# Patient Record
Sex: Male | Born: 1985 | Hispanic: No | Marital: Single | Smoking: Never smoker
Health system: Southern US, Community
[De-identification: ages and names within clinical notes are randomized; demographics above are authoritative.]

## PROBLEM LIST (undated history)

## (undated) DIAGNOSIS — E119 Type 2 diabetes mellitus without complications: Secondary | ICD-10-CM

## (undated) HISTORY — PX: TONSILLECTOMY: SUR1361

---
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
## 2015-09-29 ENCOUNTER — Emergency Department (HOSPITAL_BASED_OUTPATIENT_CLINIC_OR_DEPARTMENT_OTHER)
Admission: EM | Admit: 2015-09-29 | Discharge: 2015-09-29 | Disposition: A | Discharge status: Home / Self Care | Payer: 59 | Attending: Emergency Medicine | Admitting: Emergency Medicine

## 2015-09-29 ENCOUNTER — Encounter (HOSPITAL_BASED_OUTPATIENT_CLINIC_OR_DEPARTMENT_OTHER): Payer: Self-pay | Admitting: Emergency Medicine

## 2015-09-29 DIAGNOSIS — E1065 Type 1 diabetes mellitus with hyperglycemia: Secondary | ICD-10-CM | POA: Insufficient documentation

## 2015-09-29 DIAGNOSIS — R739 Hyperglycemia, unspecified: Secondary | ICD-10-CM

## 2015-09-29 DIAGNOSIS — Z760 Encounter for issue of repeat prescription: Secondary | ICD-10-CM | POA: Diagnosis not present

## 2015-09-29 HISTORY — DX: Type 2 diabetes mellitus without complications: E11.9

## 2015-09-29 LAB — CBC WITH DIFFERENTIAL/PLATELET
BASOS ABS: 0 10*3/uL (ref 0.0–0.1)
BASOS PCT: 1 %
Eosinophils Absolute: 0.2 10*3/uL (ref 0.0–0.7)
Eosinophils Relative: 3 %
HEMATOCRIT: 47.6 % (ref 39.0–52.0)
Hemoglobin: 16.8 g/dL (ref 13.0–17.0)
LYMPHS PCT: 35 %
Lymphs Abs: 2.2 10*3/uL (ref 0.7–4.0)
MCH: 31.4 pg (ref 26.0–34.0)
MCHC: 35.3 g/dL (ref 30.0–36.0)
MCV: 89 fL (ref 78.0–100.0)
MONO ABS: 0.5 10*3/uL (ref 0.1–1.0)
Monocytes Relative: 8 %
NEUTROS ABS: 3.4 10*3/uL (ref 1.7–7.7)
NEUTROS PCT: 53 %
Platelets: 246 10*3/uL (ref 150–400)
RBC: 5.35 MIL/uL (ref 4.22–5.81)
RDW: 11.7 % (ref 11.5–15.5)
WBC: 6.2 10*3/uL (ref 4.0–10.5)

## 2015-09-29 LAB — BASIC METABOLIC PANEL
ANION GAP: 14 (ref 5–15)
BUN: 14 mg/dL (ref 6–20)
CALCIUM: 9.5 mg/dL (ref 8.9–10.3)
CO2: 25 mmol/L (ref 22–32)
Chloride: 95 mmol/L — ABNORMAL LOW (ref 101–111)
Creatinine, Ser: 1.13 mg/dL (ref 0.61–1.24)
GLUCOSE: 391 mg/dL — AB (ref 65–99)
POTASSIUM: 3.8 mmol/L (ref 3.5–5.1)
Sodium: 134 mmol/L — ABNORMAL LOW (ref 135–145)

## 2015-09-29 LAB — URINE MICROSCOPIC-ADD ON
BACTERIA UA: NONE SEEN
RBC / HPF: NONE SEEN RBC/hpf (ref 0–5)
Squamous Epithelial / LPF: NONE SEEN

## 2015-09-29 LAB — URINALYSIS, ROUTINE W REFLEX MICROSCOPIC
Bilirubin Urine: NEGATIVE
Glucose, UA: >1000 mg/dL — AB
Hgb urine dipstick: NEGATIVE
KETONES UR: 40 mg/dL — AB
LEUKOCYTES UA: NEGATIVE
NITRITE: NEGATIVE
PH: 5 (ref 5.0–8.0)
PROTEIN: NEGATIVE mg/dL
Specific Gravity, Urine: 1.044 — ABNORMAL HIGH (ref 1.005–1.030)

## 2015-09-29 LAB — CBG MONITORING, ED
GLUCOSE-CAPILLARY: 381 mg/dL — AB (ref 65–99)
Glucose-Capillary: 301 mg/dL — ABNORMAL HIGH (ref 65–99)

## 2015-09-29 MED ORDER — INSULIN GLARGINE 100 UNIT/ML ~~LOC~~ SOLN: 22.0000 [IU] | Freq: Every day | SUBCUTANEOUS | Status: DC

## 2015-09-29 MED ORDER — SODIUM CHLORIDE 0.9 % IV BOLUS (SEPSIS)
1000.0000 mL | Freq: Once | INTRAVENOUS | Status: AC
  Administered 2015-09-29: 1000 mL via INTRAVENOUS

## 2015-09-29 MED ORDER — INSULIN ASPART 100 UNIT/ML ~~LOC~~ SOLN
10.0000 [IU] | Freq: Once | SUBCUTANEOUS | Status: AC
  Administered 2015-09-29: 10 [IU] via SUBCUTANEOUS
  Filled 2015-09-29: qty 1

## 2015-09-29 MED ORDER — METFORMIN HCL 500 MG PO TABS: 500.0000 mg | ORAL_TABLET | Freq: Two times a day (BID) | ORAL | Status: DC

## 2015-09-29 NOTE — ED Notes (Signed)
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

## 2015-09-29 NOTE — ED Provider Notes (Signed)
CSN: [id number]     Arrival date & time 09/29/15  1054 History   First MD Initiated Contact with Patient 09/29/15 1110     No chief complaint on file.    (Consider location/radiation/quality/duration/timing/severity/associated sxs/prior Treatment) HPI   30 year old male with history of juvenile diabetes presenting with concerns for high blood sugar. Patient states he ran out of his medication for the past 2 weeks due to a missed appointment with his PCP for medication refill. States that his father passed away 2 weeks ago and since then he has been going through the grieving process and having been taking care of himself appropriately. He was taking Lantus and metformin. He reports polyuria, polydipsia, generalized fatigue, then today he check his blood sugar and it was in the 500s. He denies any recent sickness, fever, productive cough, chest pain, shortness of breath, or rash. No other complaint. Denies alcohol abuse or tobacco abuse.  History reviewed. No pertinent past medical history. History reviewed. No pertinent past surgical history. No family history on file. Social History  Substance Use Topics  . Smoking status: None  . Smokeless tobacco: None  . Alcohol Use: None    Review of Systems  All other systems reviewed and are negative.     Allergies  Review of patient's allergies indicates not on file.  Home Medications   Prior to Admission medications   Not on File   BP 147/97 mmHg  Pulse 67  Temp(Src) 97.6 F (36.4 C) (Oral)  Resp 20  Ht  (1.981 m)  Wt 124.427 kg  BMI 31.71 kg/m2  SpO2 100% Physical Exam  Constitutional: He is oriented to person, place, and time. He appears well-developed and well-nourished. No distress.  African-American male, well appearing, nontoxic in appearance.  HENT:  Head: Atraumatic.  Mouth/Throat: Oropharynx is clear and moist.  Eyes: Conjunctivae are normal.  Neck: Neck supple.  Cardiovascular: Normal rate and regular  rhythm.   Pulmonary/Chest: Effort normal and breath sounds normal.  Abdominal: Soft. There is no tenderness.  Neurological: He is alert and oriented to person, place, and time.  Skin: No rash noted.  Psychiatric: He has a normal mood and affect.  Nursing note and vitals reviewed.   ED Course  Procedures (including critical care time) Labs Review Labs Reviewed  BASIC METABOLIC PANEL - Abnormal; Notable for the following:    Sodium 134 (*)    Chloride 95 (*)    Glucose, Bld 391 (*)    All other components within normal limits  URINALYSIS, ROUTINE W REFLEX MICROSCOPIC (NOT AT [facility]) - Abnormal; Notable for the following:    Color, Urine STRAW (*)    Specific Gravity, Urine 1.044 (*)    Glucose, UA >1000 (*)    Ketones, ur 40 (*)    All other components within normal limits  CBG MONITORING, ED - Abnormal; Notable for the following:    Glucose-Capillary 381 (*)    All other components within normal limits  CBG MONITORING, ED - Abnormal; Notable for the following:    Glucose-Capillary 301 (*)    All other components within normal limits  CBC WITH DIFFERENTIAL/PLATELET  URINE MICROSCOPIC-ADD ON    Imaging Review No results found. I have personally reviewed and evaluated these images and lab results as part of my medical decision-making.   EKG Interpretation None      MDM   Final diagnoses:  Encounter for medication refill  Hyperglycemia    BP 136/74 mmHg  Pulse 68  Temp(Src) 97.6 F (36.4 C) (Oral)  Resp 20  Ht 6\' 6"  (1.981 m)  Wt 124.427 kg  BMI 31.71 kg/m2  SpO2 100%   11:26 AM Patient with history of insulin-dependent diabetes who ran out of his medication and is here with symptoms concerning for hypoglycemia and potential DKA. Patient however is well-appearing, no change in mental status and without any signs of infection. His current CBG is 381. We'll perform screening labs, give IV fluid, and if there is no significant metabolic derangement patient is  likely stable to be discharged with medication refill and close follow-up with PCP.  3:38 PM Although patient has ketones in urine, no evidence of DKA as his anion gap is normal. CBG improves after receiving insulin and fluid. Patient resting comfortably. I will prescribe his metformin and insulin as requested and strongly encouraged patient to follow-up closely with his primary care provider for further management of his condition. Return precaution discussed. Patient is stable for discharge. All questions answered to patient's satisfaction.  Fayrene HelperBowie Lakrisha Iseman, PA-C 09/29/15 1539  Lavera Guiseana Duo Liu, MD 09/29/15 1944

## 2015-09-29 NOTE — Discharge Instructions (Signed)
Please monitor your blood sugar regularly, take your medications as prescribed and follow up with your doctor for further care.    Hyperglycemia Hyperglycemia occurs when the glucose (sugar) in your blood is too high. Hyperglycemia can happen for many reasons, but it most often happens to people who do not know they have diabetes or are not managing their diabetes properly.  CAUSES  Whether you have diabetes or not, there are other causes of hyperglycemia. Hyperglycemia can occur when you have diabetes, but it can also occur in other situations that you might not be as aware of, such as: Diabetes  If you have diabetes and are having problems controlling your blood glucose, hyperglycemia could occur because of some of the following reasons:  Not following your meal plan.  Not taking your diabetes medications or not taking it properly.  Exercising less or doing less activity than you normally do.  Being sick. Pre-diabetes  This cannot be ignored. Before people develop Type 2 diabetes, they almost always have "pre-diabetes." This is when your blood glucose levels are higher than normal, but not yet high enough to be diagnosed as diabetes. Research has shown that some long-term damage to the body, especially the heart and circulatory system, may already be occurring during pre-diabetes. If you take action to manage your blood glucose when you have pre-diabetes, you may delay or prevent Type 2 diabetes from developing. Stress  If you have diabetes, you may be "diet" controlled or on oral medications or insulin to control your diabetes. However, you may find that your blood glucose is higher than usual in the hospital whether you have diabetes or not. This is often referred to as "stress hyperglycemia." Stress can elevate your blood glucose. This happens because of hormones put out by the body during times of stress. If stress has been the cause of your high blood glucose, it can be followed  regularly by your caregiver. That way he/she can make sure your hyperglycemia does not continue to get worse or progress to diabetes. Steroids  Steroids are medications that act on the infection fighting system (immune system) to block inflammation or infection. One side effect can be a rise in blood glucose. Most people can produce enough extra insulin to allow for this rise, but for those who cannot, steroids make blood glucose levels go even higher. It is not unusual for steroid treatments to "uncover" diabetes that is developing. It is not always possible to determine if the hyperglycemia will go away after the steroids are stopped. A special blood test called an A1c is sometimes done to determine if your blood glucose was elevated before the steroids were started. SYMPTOMS  Thirsty.  Frequent urination.  Dry mouth.  Blurred vision.  Tired or fatigue.  Weakness.  Sleepy.  Tingling in feet or leg. DIAGNOSIS  Diagnosis is made by monitoring blood glucose in one or all of the following ways:  A1c test. This is a chemical found in your blood.  Fingerstick blood glucose monitoring.  Laboratory results. TREATMENT  First, knowing the cause of the hyperglycemia is important before the hyperglycemia can be treated. Treatment may include, but is not be limited to:  Education.  Change or adjustment in medications.  Change or adjustment in meal plan.  Treatment for an illness, infection, etc.  More frequent blood glucose monitoring.  Change in exercise plan.  Decreasing or stopping steroids.  Lifestyle changes. HOME CARE INSTRUCTIONS   Test your blood glucose as directed.  Exercise regularly.  Your caregiver will give you instructions about exercise. Pre-diabetes or diabetes which comes on with stress is helped by exercising.  Eat wholesome, balanced meals. Eat often and at regular, fixed times. Your caregiver or nutritionist will give you a meal plan to guide your sugar  intake.  Being at an ideal weight is important. If needed, losing as little as 10 to 15 pounds may help improve blood glucose levels. SEEK MEDICAL CARE IF:   You have questions about medicine, activity, or diet.  You continue to have symptoms (problems such as increased thirst, urination, or weight gain). SEEK IMMEDIATE MEDICAL CARE IF:   You are vomiting or have diarrhea.  Your breath smells fruity.  You are breathing faster or slower.  You are very sleepy or incoherent.  You have numbness, tingling, or pain in your feet or hands.  You have chest pain.  Your symptoms get worse even though you have been following your caregiver's orders.  If you have any other questions or concerns.   This information is not intended to replace advice given to you by your health care provider. Make sure you discuss any questions you have with your health care provider.   Document Released: 09/30/2000 Document Revised: 06/29/2011 Document Reviewed: 12/11/2014 Elsevier Interactive Patient Education [employer]

## 2021-07-04 ENCOUNTER — Ambulatory Visit (HOSPITAL_BASED_OUTPATIENT_CLINIC_OR_DEPARTMENT_OTHER)
Admission: RE | Admit: 2021-07-04 | Discharge: 2021-07-04 | Disposition: A | Discharge status: Home / Self Care | Payer: 59 | Source: Ambulatory Visit | Attending: Family Medicine | Admitting: Family Medicine

## 2021-07-04 ENCOUNTER — Other Ambulatory Visit: Payer: Self-pay

## 2021-07-04 ENCOUNTER — Encounter: Payer: Self-pay | Admitting: Family Medicine

## 2021-07-04 ENCOUNTER — Ambulatory Visit (INDEPENDENT_AMBULATORY_CARE_PROVIDER_SITE_OTHER): Payer: 59 | Admitting: Family Medicine

## 2021-07-04 VITALS — BP 158/98 | HR 69 | Ht 78.0 in | Wt 245.6 lb

## 2021-07-04 DIAGNOSIS — M5442 Lumbago with sciatica, left side: Secondary | ICD-10-CM | POA: Insufficient documentation

## 2021-07-04 DIAGNOSIS — R030 Elevated blood-pressure reading, without diagnosis of hypertension: Secondary | ICD-10-CM | POA: Diagnosis not present

## 2021-07-04 DIAGNOSIS — M5441 Lumbago with sciatica, right side: Secondary | ICD-10-CM

## 2021-07-04 DIAGNOSIS — E119 Type 2 diabetes mellitus without complications: Secondary | ICD-10-CM
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
## 2021-07-04 NOTE — Patient Instructions (Signed)
Blood pressure is not at goal for age and co-morbidities.  I recommend monitoring for 2 weeks then follow-up.  In addition they were instructed on the following: ?- BP goal <130/80 ?- monitor and log blood pressures at home ?- check around the same time each day in a relaxed setting ?- Limit salt to <2000 mg/day ?- Follow DASH eating plan (heart healthy diet) ?- limit alcohol to 2 standard drinks per day for men and 1 per day for women ?- avoid tobacco products ?- get at least 2 hours of regular aerobic exercise weekly ?Patient aware of signs/symptoms requiring further/urgent evaluation. ? ? ?Thank you for choosing [facility]. I am excited for the opportunity to partner with you to meet your health care goals. It was a pleasure meeting you today! ? ? ?Information on diet, exercise, and health maintenance recommendations are listed below. This is information to help you be sure you are on track for optimal health and monitoring.  ? ?Please look over this and let us know if you have any questions or if you have completed any of the health maintenance outside of [facility] so that we can be sure your records are up to date.  ?___________________________________________________________ ? ?MyChart:  ?For all urgent or time sensitive needs we ask that you please call the office to avoid delays. Our number is [phone]. ?MyChart is not constantly monitored and due to the large volume of messages a day, replies may take up to 72 business hours. ? ?MyChart Policy: ?MyChart allows for you to see your visit notes, after visit summary, provider recommendations, lab and tests results, make an appointment, request refills, and contact your provider or the office for non-urgent questions or concerns. Providers are seeing patients during normal business hours and do not have built in time to review MyChart messages.  ?We ask that you allow a minimum of 3 business  days for responses to [facility] messages. For this reason, please do not send urgent requests through MyChart. Please call the office at [phone]. ?New and ongoing conditions may require a visit. We have virtual and in-person visits available for your convenience.  ?Complex MyChart concerns may require a visit. Your provider may request you schedule a virtual or in-person visit to ensure we are providing the best care possible. ?MyChart messages sent after 11:00 AM on Friday will not be received by the provider until Monday morning.  ?  ?Lab and Test Results: ?You will receive your lab and test results on MyChart as soon as they are completed and results have been sent by the lab or testing facility. Due to this service, you will receive your results BEFORE your provider.  ?I review lab and test results each morning prior to seeing patients. Some results require collaboration with other providers to ensure you are receiving the most appropriate care. For this reason, we ask that you please allow a minimum of 3-5 business days from the time that ALL results have been received for your provider to receive and review lab and test results and contact you about these.  ?Most lab and test result comments from the provider will be sent through MyChart. Your provider may recommend changes to the plan of care, follow-up visits, repeat testing, ask questions, or request an office visit to discuss these results. You may reply directly to this message or call the office to provide information for the provider or set up an  appointment. ?In some instances, you will be called with test results and recommendations. Please let us know if this is preferred and we will make note of this in your chart to provide this for you.    ?If you have not heard a response to your lab or test results in 5 business days from all results returning to MyChart, please call the office to let us know. We ask that you please avoid calling prior to  this time unless there is an emergent concern. Due to high call volumes, this can delay the resulting process. ? ?After Hours: ?For all non-emergency after hours needs, please call the office at [phone] and select the option to reach the on-call  service. On-call services are shared between multiple [place] offices and therefore it will not be possible to speak directly with your provider. On-call providers may provide medical advice and recommendations, but are unable to provide refills for maintenance medications.  ?For all emergency or urgent medical needs after normal business hours, we recommend that you seek care at the closest Urgent Care or Emergency Department to ensure appropriate treatment in a timely manner.  ?[facility] at [place] has a 24 hour emergency room located on the ground floor for your convenience.  ? ?Urgent Concerns During the Business Day ?Providers are seeing patients from 8AM to 5PM with a busy schedule and are most often not able to respond to non-urgent calls until the end of the day or the next business day. ?If you should have URGENT concerns during the day, please call and speak to the nurse or schedule a same day appointment so that we can address your concern without delay.  ? ?Thank you, again, for choosing me as your health care partner. I appreciate your trust and look forward to learning more about you.  ? ?Lollie Marrow. Reola Calkins, DNP, FNP-C ? ?___________________________________________________________ ? ?Health Maintenance Recommendations ?Screening Testing ?Mammogram ?Every 1-2 years based on history and risk factors ?Starting at age 23 ?Pap Smear ?Ages 21-39 every 3 years ?Ages 56-65 every 5 years with HPV testing ?More frequent testing may be required based on results and history ?Colon Cancer Screening ?Every 1-10 years based on test performed, risk factors, and history ?Starting at age 28 ?Bone Density Screening ?Every 2-10 years based on history ?Starting  at age 10 for women ?Recommendations for men differ based on medication usage, history, and risk factors ?AAA Screening ?One time ultrasound ?Men 32-36 years old who have ever smoked ?Lung Cancer Screening ?Low Dose Lung CT every 12 months ?Age 6-80 years with a 20 pack-year smoking history who still smoke or who have quit within the last 15 years ? ?Screening Labs ?Routine  Labs: Complete Blood Count (CBC), Complete Metabolic Panel (CMP), Cholesterol (Lipid Panel) ?Every 6-12 months based on history and medications ?May be recommended more frequently based on current conditions or previous results ?Hemoglobin A1c Lab ?Every 3-12 months based on history and previous results ?Starting at age 36 or earlier with diagnosis of diabetes, high cholesterol, BMI >26, and/or risk factors ?Frequent monitoring for patients with diabetes to ensure blood sugar control ?Thyroid Panel (TSH w/ T3 & T4) ?Every 6 months based on history, symptoms, and risk factors ?May be repeated more often if on medication ?HIV ?One time testing for all patients 51 and older ?May be repeated more frequently for patients with increased risk factors or exposure ?Hepatitis C ?One time testing for all patients 95 and older ?May be repeated more frequently  for patients with increased risk factors or exposure ?Gonorrhea, Chlamydia ?Every 12 months for all sexually active persons 13-24 years ?Additional monitoring may be recommended for those who are considered high risk or who have symptoms ?PSA ?Men 60-51 years old with risk factors ?Additional screening may be recommended from age 6-69 based on risk factors, symptoms, and history ? ?Vaccine Recommendations ?Tetanus Booster ?All adults every 10 years ?Flu Vaccine ?All patients 6 months and older every year ?COVID Vaccine ?All patients 12 years and older ?Initial dosing with booster ?May recommend additional booster based on age and health history ?HPV Vaccine ?2 doses all patients age 40-26 ?Dosing may  be considered for patients over 26 ?Shingles Vaccine (Shingrix) ?2 doses all adults 50 years and older ?Pneumonia (Pneumovax 23) ?All adults 65 years and older ?May recommend earlier dosing based on healt

## 2021-07-07 ENCOUNTER — Encounter: Payer: Self-pay | Admitting: *Deleted

## 2021-07-18 ENCOUNTER — Encounter: Payer: Self-pay | Admitting: Family Medicine

## 2021-07-18 ENCOUNTER — Other Ambulatory Visit (HOSPITAL_BASED_OUTPATIENT_CLINIC_OR_DEPARTMENT_OTHER): Payer: Self-pay

## 2021-07-18 ENCOUNTER — Ambulatory Visit (INDEPENDENT_AMBULATORY_CARE_PROVIDER_SITE_OTHER): Payer: 59 | Admitting: Family Medicine

## 2021-07-18 VITALS — BP 150/82 | HR 80 | Temp 97.6°F | Resp 16 | Ht 78.0 in | Wt 243.4 lb

## 2021-07-18 DIAGNOSIS — I10 Essential (primary) hypertension: Secondary | ICD-10-CM | POA: Diagnosis not present

## 2021-07-18 LAB — BASIC METABOLIC PANEL
BUN: 18 mg/dL (ref 6–23)
CO2: 31 mEq/L (ref 19–32)
Calcium: 9.5 mg/dL (ref 8.4–10.5)
Chloride: 102 mEq/L (ref 96–112)
Creatinine, Ser: 0.88 mg/dL (ref 0.40–1.50)
GFR: 112.69 mL/min (ref >60.00)
Glucose, Bld: 121 mg/dL — ABNORMAL HIGH (ref 70–99)
Potassium: 4.4 mEq/L (ref 3.5–5.1)
Sodium: 139 mEq/L (ref 135–145)

## 2021-07-18 MED ORDER — LOSARTAN POTASSIUM 25 MG PO TABS
25.0000 mg | ORAL_TABLET | Freq: Every day | ORAL | 1 refills | Status: DC
  Filled 2021-07-18: qty 30, 30d supply, fill #0

## 2021-07-18 NOTE — Assessment & Plan Note (Signed)
Blood pressure is not at goal for age and co-morbidities.  I recommend starting losartan 25 mg daily.   ?- BP goal <130/80 ?- monitor and log blood pressures at home ?- check around the same time each day in a relaxed setting ?- Limit salt to <2000 mg/day ?- Follow DASH eating plan (heart healthy diet) ?- limit alcohol to 2 standard drinks per day for men and 1 per day for women ?- avoid tobacco products ?- get at least 2 hours of regular aerobic exercise weekly ?Patient aware of signs/symptoms requiring further/urgent evaluation. ?Labs updated today. ?BP check in 2 weeks with additional BMP. ?

## 2021-07-18 NOTE — Progress Notes (Signed)
? ?Established Patient Office Visit ? ?Subjective:  ?Patient ID: Terrance Smith, male    DOB: 1986/01/10  Age: 36 y.o. MRN: [id number] ? ?CC:  ?Chief Complaint  ?Patient presents with  ? Follow-up  ?  Follow Up   ? ? ?HPI ?Terrance Smith presents for BP follow-up.  ? ?He has not been checking his BP at home, but states it was checked at the chiropractor recently and he has borrowed grandmothers cuff a few times: SBP 140-150s. He is still struggling with the back pain - at last visit he was referred to Ortho and PT -scheduled for next week. He denies any chest pain, palpitations, dyspnea, edema, recurrent headaches, vision changes.  ? ? ? ?Past Medical History:  ?Diagnosis Date  ? Diabetes mellitus without complication (HCC)   ? ? ?Past Surgical History:  ?Procedure Laterality Date  ? TONSILLECTOMY    ? ? ?Family History  ?Problem Relation Age of Onset  ? Diabetes Father   ? Diabetes Paternal Grandmother   ? ? ?Social History  ? ?Socioeconomic History  ? Marital status: Single  ?  Spouse name: Not on file  ? Number of children: Not on file  ? Years of education: Not on file  ? Highest education level: Not on file  ?Occupational History  ? Not on file  ?Tobacco Use  ? Smoking status: Never  ? Smokeless tobacco: Not on file  ?Substance and Sexual Activity  ? Alcohol use: No  ? Drug use: Not on file  ? Sexual activity: Not on file  ?Other Topics Concern  ? Not on file  ?Social History Narrative  ? Not on file  ? ?Social Determinants of Health  ? ?Financial Resource Strain: Not on file  ?Food Insecurity: Not on file  ?Transportation Needs: Not on file  ?Physical Activity: Not on file  ?Stress: Not on file  ?Social Connections: Not on file  ?Intimate Partner Violence: Not on file  ? ? ?Outpatient Medications Prior to Visit  ?Medication Sig Dispense Refill  ? metFORMIN (GLUCOPHAGE) 500 MG tablet Take 1 tablet (500 mg total) by mouth 2 (two) times daily with a meal. 60 tablet 0  ? ?No facility-administered medications prior to  visit.  ? ? ?No Known Allergies ? ?ROS ?Review of Systems ?All review of systems negative except what is listed in the HPI ? ?  ?Objective:  ?  ?Physical Exam ?Vitals reviewed.  ?Constitutional:   ?   Appearance: Normal appearance. He is normal weight.  ?Cardiovascular:  ?   Rate and Rhythm: Normal rate and regular rhythm.  ?Pulmonary:  ?   Effort: Pulmonary effort is normal.  ?   Breath sounds: Normal breath sounds.  ?Musculoskeletal:  ?   Right lower leg: No edema.  ?   Left lower leg: No edema.  ?Skin: ?   General: Skin is warm and dry.  ?Neurological:  ?   General: No focal deficit present.  ?   Mental Status: He is alert and oriented to person, place, and time. Mental status is at baseline.  ?Psychiatric:     ?   Mood and Affect: Mood normal.     ?   Behavior: Behavior normal.     ?   Thought Content: Thought content normal.     ?   Judgment: Judgment normal.  ? ? ?BP (!) 150/82   Pulse 80   Temp 97.6 ?F (36.4 ?C) (Oral)   Resp 16   Ht 6\' 6"  (1.981  m)   Wt 243 lb 6.4 oz (110.4 kg)   SpO2 98%   BMI 28.13 kg/m?  ?Wt Readings from Last 3 Encounters:  ?07/18/21 243 lb 6.4 oz (110.4 kg)  ?07/04/21 245 lb 9.6 oz (111.4 kg)  ?09/29/15 274 lb 5 oz (124.4 kg)  ? ? ? ?Health Maintenance Due  ?Topic Date Due  ? HEMOGLOBIN A1C  Never done  ? COVID-19 Vaccine (1) Never done  ? FOOT EXAM  Never done  ? OPHTHALMOLOGY EXAM  Never done  ? URINE MICROALBUMIN  Never done  ? HIV Screening  Never done  ? Hepatitis C Screening  Never done  ? TETANUS/TDAP  11/15/2012  ? ? ?There are no preventive care reminders to display for this patient. ? ?No results found for: TSH ?Lab Results  ?Component Value Date  ? WBC 6.2 09/29/2015  ? HGB 16.8 09/29/2015  ? HCT 47.6 09/29/2015  ? MCV 89.0 09/29/2015  ? PLT 246 09/29/2015  ? ?Lab Results  ?Component Value Date  ? NA 134 (L) 09/29/2015  ? K 3.8 09/29/2015  ? CO2 25 09/29/2015  ? GLUCOSE 391 (H) 09/29/2015  ? BUN 14 09/29/2015  ? CREATININE 1.13 09/29/2015  ? CALCIUM 9.5 09/29/2015  ?  ANIONGAP 14 09/29/2015  ? ?No results found for: CHOL ?No results found for: HDL ?No results found for: LDLCALC ?No results found for: TRIG ?No results found for: CHOLHDL ?No results found for: HGBA1C ? ?  ?Assessment & Plan:  ? ?1. Hypertension, unspecified type ?Blood pressure is not at goal for age and co-morbidities.  I recommend starting losartan 25 mg daily.   ?- BP goal <130/80 ?- monitor and log blood pressures at home ?- check around the same time each day in a relaxed setting ?- Limit salt to <2000 mg/day ?- Follow DASH eating plan (heart healthy diet) ?- limit alcohol to 2 standard drinks per day for men and 1 per day for women ?- avoid tobacco products ?- get at least 2 hours of regular aerobic exercise weekly ?Patient aware of signs/symptoms requiring further/urgent evaluation. ?Labs updated today. ?BP check in 2 weeks with additional BMP. ? ?- losartan (COZAAR) 25 MG tablet; Take 1 tablet (25 mg total) by mouth daily.  Dispense: 30 tablet; Refill: 1 ?- Basic metabolic panel ?- Basic metabolic panel; Future ? ? ?Follow-up: Return in about 2 weeks (around 08/01/2021) for BP check and labs .  ? ? ?Clayborne Dana, NP ?

## 2021-07-18 NOTE — Patient Instructions (Signed)
Blood pressure is not at goal for age and co-morbidities.  I recommend starting losartan 25 mg daily.   ?- BP goal <130/80 ?- monitor and log blood pressures at home ?- check around the same time each day in a relaxed setting ?- Limit salt to <2000 mg/day ?- Follow DASH eating plan (heart healthy diet) ?- limit alcohol to 2 standard drinks per day for men and 1 per day for women ?- avoid tobacco products ?- get at least 2 hours of regular aerobic exercise weekly ?Patient aware of signs/symptoms requiring further/urgent evaluation. ?Labs updated today. ?BP check in 2 weeks with additional BMP. ?

## 2021-07-24 ENCOUNTER — Encounter: Payer: Self-pay | Admitting: Physician Assistant

## 2021-07-24 ENCOUNTER — Ambulatory Visit: Payer: 59 | Admitting: Physician Assistant

## 2021-07-24 DIAGNOSIS — M5440 Lumbago with sciatica, unspecified side: Secondary | ICD-10-CM

## 2021-07-24 DIAGNOSIS — M545 Low back pain, unspecified: Secondary | ICD-10-CM | POA: Insufficient documentation

## 2021-07-24 MED ORDER — METHYLPREDNISOLONE 4 MG PO TBPK: ORAL_TABLET | ORAL | 0 refills | Status: DC
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
## 2021-07-25 ENCOUNTER — Ambulatory Visit: Payer: 59 | Attending: Family Medicine | Admitting: Physical Therapy

## 2021-07-25 DIAGNOSIS — M5441 Lumbago with sciatica, right side: Secondary | ICD-10-CM | POA: Insufficient documentation

## 2021-07-25 DIAGNOSIS — M6281 Muscle weakness (generalized): Secondary | ICD-10-CM | POA: Insufficient documentation

## 2021-07-25 DIAGNOSIS — R252 Cramp and spasm: Secondary | ICD-10-CM | POA: Insufficient documentation

## 2021-08-01 ENCOUNTER — Other Ambulatory Visit: Payer: 59

## 2021-08-01 ENCOUNTER — Ambulatory Visit: Payer: 59 | Admitting: Physical Therapy

## 2021-08-01 ENCOUNTER — Ambulatory Visit: Payer: 59

## 2021-08-08 ENCOUNTER — Ambulatory Visit: Payer: 59 | Admitting: Physical Therapy

## 2021-08-08 ENCOUNTER — Encounter: Payer: Self-pay | Admitting: *Deleted

## 2021-08-08 ENCOUNTER — Other Ambulatory Visit (INDEPENDENT_AMBULATORY_CARE_PROVIDER_SITE_OTHER): Payer: 59

## 2021-08-08 ENCOUNTER — Other Ambulatory Visit (HOSPITAL_BASED_OUTPATIENT_CLINIC_OR_DEPARTMENT_OTHER): Payer: Self-pay

## 2021-08-08 ENCOUNTER — Encounter: Payer: Self-pay | Admitting: Physical Therapy

## 2021-08-08 ENCOUNTER — Ambulatory Visit (INDEPENDENT_AMBULATORY_CARE_PROVIDER_SITE_OTHER): Payer: 59 | Admitting: Family Medicine

## 2021-08-08 VITALS — BP 144/100 | HR 64

## 2021-08-08 DIAGNOSIS — R252 Cramp and spasm: Secondary | ICD-10-CM

## 2021-08-08 DIAGNOSIS — M5441 Lumbago with sciatica, right side: Secondary | ICD-10-CM | POA: Diagnosis present

## 2021-08-08 DIAGNOSIS — I10 Essential (primary) hypertension: Secondary | ICD-10-CM

## 2021-08-08 DIAGNOSIS — M6281 Muscle weakness (generalized): Secondary | ICD-10-CM

## 2021-08-08 LAB — BASIC METABOLIC PANEL
BUN: 18 mg/dL (ref 6–23)
CO2: 30 mEq/L (ref 19–32)
Calcium: 9.5 mg/dL (ref 8.4–10.5)
Chloride: 105 mEq/L (ref 96–112)
Creatinine, Ser: 0.89 mg/dL (ref 0.40–1.50)
GFR: 112.26 mL/min (ref >60.00)
Glucose, Bld: 101 mg/dL — ABNORMAL HIGH (ref 70–99)
Potassium: 4.6 mEq/L (ref 3.5–5.1)
Sodium: 142 mEq/L (ref 135–145)

## 2021-08-08 MED ORDER — LOSARTAN POTASSIUM 50 MG PO TABS
ORAL_TABLET | ORAL | 1 refills | Status: DC
  Filled 2021-08-08: qty 30, 30d supply, fill #0
  Filled 2021-09-11 – 2021-09-29 (×2): qty 30, 30d supply, fill #1

## 2021-08-08 NOTE — Progress Notes (Signed)
Pt here for Blood pressure check per Clayborne Dana, NP ? ?Pt currently takes: Losartan 25 MG ?Pt reports compliance with medication. ? ? ?BP Readings from Last 3 Encounters:  ?07/18/21 (!) 150/82  ?07/04/21 (!) 158/98  ?09/29/15 136/74  ?  ? ?BP today: L arm 149/90,  after sitting for 5 minutes 144/100 ? ?HR: 64 ? ? ?Pt advised to increase Losartan 25 MG to 50 MG, nurse visit blood pressure check in 2 weeks with repeat BMP. ? ?Pt states he has upcoming appt on May 19th and would like to do BP check and blood work then. Pt also prefers to take one pill, 50MG  refill of Losartan sent per pt request.  ?

## 2021-08-08 NOTE — Patient Instructions (Signed)
Access Code: [email or web address] ?URL: [email or web address] ?Date: 08/08/2021 ?Prepared by: Harrie Foreman ? ?Exercises ?- Supine 90/90 Sciatic Nerve Glide with Knee Flexion/Extension  - 1 x daily - 7 x weekly - 3 sets - 10 reps ?- Supine Lower Trunk Rotation  - 1 x daily - 7 x weekly - 3 sets - 10 reps ?- Hooklying Single Knee to Chest Stretch  - 1 x daily - 7 x weekly - 3 sets - 10 reps ?- Right Standing Lateral Shift Correction at Wall - Repetitions  - 1 x daily - 7 x weekly - 3 sets - 10 reps ?
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
## 2021-08-08 NOTE — Progress Notes (Signed)
Medical screening examination/treatment was performed by qualified clinical staff member and I was immediately available for consultation/collaboration. I have reviewed documentation and agree with assessment and plan. ? ?Terrilyn Saver, NP ? ?

## 2021-08-15 ENCOUNTER — Ambulatory Visit: Payer: 59 | Admitting: Physical Therapy

## 2021-08-22 ENCOUNTER — Ambulatory Visit: Payer: 59 | Attending: Family Medicine

## 2021-08-22 ENCOUNTER — Other Ambulatory Visit (INDEPENDENT_AMBULATORY_CARE_PROVIDER_SITE_OTHER): Payer: 59 | Admitting: Physician Assistant

## 2021-08-22 ENCOUNTER — Ambulatory Visit: Payer: 59 | Admitting: Physician Assistant

## 2021-08-22 ENCOUNTER — Encounter: Payer: 59 | Admitting: Physical Therapy

## 2021-08-22 DIAGNOSIS — M5440 Lumbago with sciatica, unspecified side: Secondary | ICD-10-CM

## 2021-08-22 DIAGNOSIS — M6281 Muscle weakness (generalized): Secondary | ICD-10-CM | POA: Diagnosis present

## 2021-08-22 DIAGNOSIS — M5441 Lumbago with sciatica, right side: Secondary | ICD-10-CM | POA: Insufficient documentation

## 2021-08-22 DIAGNOSIS — R252 Cramp and spasm: Secondary | ICD-10-CM | POA: Insufficient documentation

## 2021-08-22 MED ORDER — METHYLPREDNISOLONE 4 MG PO TBPK: ORAL_TABLET | ORAL | 0 refills | Status: DC

## 2021-08-22 NOTE — Therapy (Addendum)
PHYSICAL THERAPY DISCHARGE SUMMARY  Visits from Start of Care: 2  Current functional level related to goals / functional outcomes: No progress   Remaining deficits: See below   Education / Equipment: HEP  Plan:   Patient goals were not met. Patient is being discharged due attendance policy.  He has had 4 no-shows and only attended 1 follow-up.     Rennie Natter, PT, DPT 09/05/2021 10:40 AM   [facility] [address]  [place], [place], [address] Phone: [phone]   Fax:  [phone]  Physical Therapy Treatment  Patient Details  Name: Terrance Smith MRN: [id number] Date of Birth: [birth year] Referring Provider (PT): Terrilyn Saver, NP   Encounter Date: 08/22/2021   PT End of Session - 08/22/21 0931     Visit Number 2    Number of Visits 10    Date for PT Re-Evaluation 09/19/21    Authorization Type [employer] VL 30    PT Start Time 0845    PT Stop Time 0929    PT Time Calculation (min) 44 min    Activity Tolerance Patient limited by pain    Behavior During Therapy [facility] for tasks assessed/performed             Past Medical History:  Diagnosis Date   Diabetes mellitus without complication ([place])     Past Surgical History:  Procedure Laterality Date   TONSILLECTOMY      There were no vitals filed for this visit.   Subjective Assessment - 08/22/21 0844     Subjective Pt reports constant leg pain, sometimes he can hit his knee on something and fall over. Reports HEP is going good, can feel his muscles working.    Pertinent History T2DM, HTN    Diagnostic tests 07/04/2021 Lumbar x ray FINDINGS:  Mild straightening of the lumbar spine. There is 5-6 mm  retrolisthesis L5-S1, likely secondary to facet arthrosis at this  level. No acute fracture of the lumbar spine. Vertebral body height  is preserved. There is intervertebral disc space narrowing and  endplate remodeling at F7-9 in keeping with  changes of mild  degenerative disc disease at this level. The paraspinal soft tissues  are unremarkable.    Patient Stated Goals get rid of pain    Currently in Pain? Yes    Pain Score 8     Pain Location Back    Pain Orientation Right;Lower    Pain Descriptors / Indicators Burning    Pain Type Acute pain                               [facility] Adult PT Treatment/Exercise - 08/22/21 0001       Lumbar Exercises: Stretches   Single Knee to Chest Stretch Right;Left;1 rep;30 seconds    Lower Trunk Rotation Limitations 10x no hold    Pelvic Tilt 10 reps    Pelvic Tilt Limitations hold 3 sec    Other Lumbar Stretch Exercise sciatic nerve glide in supine 10x bil      Lumbar Exercises: Aerobic   Recumbent Bike L1x10mn      Lumbar Exercises: Prone   Other Prone Lumbar Exercises prone on elbows 4x10"      Manual Therapy   Manual Therapy Joint mobilization    Joint Mobilization inferior hip glide for distraction  PT Short Term Goals - 08/22/21 1829       PT SHORT TERM GOAL #1   Title Pt. will be independent with initial HEP    Time 2    Period Weeks    Status On-going    Target Date 08/22/21      PT SHORT TERM GOAL #2   Title Patient will report centralization of symptoms.    Time 4    Period Weeks    Status On-going    Target Date 09/05/21               PT Long Term Goals - 08/22/21 0921       PT LONG TERM GOAL #1   Title Pt. will be independent with progressed HEP to improve core strength and outcomes.    Time 6    Period Weeks    Status On-going    Target Date 09/19/21      PT LONG TERM GOAL #2   Title Pt. will demonstrate 4+/5 bil hip strength for safety with gait.    Time 6    Period Weeks    Status On-going    Target Date 09/19/21      PT LONG TERM GOAL #3   Title Pt. will report 75% improvement in LBP    Time 6    Period Weeks    Status On-going    Target Date 09/19/21      PT LONG TERM GOAL  #4   Title Pt. will be able to ascend/descend 1 flight of stairs without increased pain and good stability    Baseline excessive effort ascending, feels unsteady descending.    Time 6    Period Weeks    Status On-going    Target Date 09/19/21      PT LONG TERM GOAL #5   Title Pt. will report at least 66% on FOTO to demonstrate improved QOL.    Baseline 49%    Time 6    Period Weeks    Status On-going    Target Date 09/19/21                   Plan - 08/22/21 0932     Clinical Impression Statement Pt showed a good response to the long leg distraction. He did have some pain when we tried a regular prone press up but good response to prone on elbows. He had a lot of bil hamstring tightness with sciatic nerve glide. He is very tight and guarded with most movements, he would continue to benefit from stretches and extensions to decrease lower back pain.    Personal Factors and Comorbidities Comorbidity 1    Comorbidities T2DM    PT Frequency Other (comment)   1-2x per week   PT Duration 6 weeks    PT Treatment/Interventions ADLs/Self Care Home Management;Electrical Stimulation;Cryotherapy;Moist Heat;Traction;Ultrasound;Stair training;Functional mobility training;Therapeutic activities;Therapeutic exercise;Neuromuscular re-education;Patient/family education;Manual techniques;Passive range of motion;Dry needling;Spinal Manipulations;Joint Manipulations    PT Next Visit Plan traction, extension exercises, continue stretching, progress HEP as tolerated    PT Home Exercise Plan Access Code: [email or web address]    Consulted and Agree with Plan of Care Patient             Patient will benefit from skilled therapeutic intervention in order to improve the following deficits and impairments:  Decreased activity tolerance, Decreased endurance, Decreased range of motion, Decreased strength, Increased fascial restricitons, Pain, Decreased balance, Decreased mobility, Difficulty walking, Increased  muscle spasms, Impaired flexibility,  Postural dysfunction  Visit Diagnosis: Acute right-sided low back pain with right-sided sciatica  Cramp and spasm  Muscle weakness (generalized)     Problem List Patient Active Problem List   Diagnosis Date Noted   Low back pain 07/24/2021   Hypertension 07/18/2021   Elevated blood pressure reading 07/04/2021   Type 2 diabetes mellitus without complication, without long-term current use of insulin ([place]) 07/04/2021    Artist Pais, PTA 08/22/2021, 11:55 AM  [facility] [address]  [place], [place], [address] Phone: [phone]   Fax:  [phone]  Name: Terrance Smith MRN: [id number] Date of Birth: Jul 12, 1985

## 2021-08-22 NOTE — Progress Notes (Signed)
? ?Office Visit Note ?  ?Patient: Terrance Smith           ?Date of Birth: [birth year]           ?MRN: [id number] ?Visit Date: 08/22/2021 ?             ?Requested by: No referring provider defined for this encounter. ?PCP: Clayborne Dana, NP ? ?No chief complaint on file. ? ? ? ? ?HPI: ?Patient is a pleasant 36 year old gentleman who I been treating for lower back pain.  He had a Medrol Dosepak a few weeks ago and has been in physical therapy.  He reported no change in his blood sugars with the Medrol Dosepak.  He feels he is much better.  He is requesting 1 more round of the steroid taper. ? ?Assessment & Plan: ?Visit Diagnoses: Low back pain ? ?Plan: Patient is improving.  I will have him do 1 more steroid taper.  He understands again if it affects his blood sugars to get to discontinue it.  He will continue with physical therapy and he knows that stretching and core exercises should be something he does when therapy is complete.  We will follow-up if needed or may call me if he does not improve and I will order an MRI ? ?Follow-Up Instructions: No follow-ups on file.  ? ?Ortho Exam ? ?Patient is alert, oriented, no adenopathy, well-dressed, normal affect, normal respiratory effort. ?He does ambulate with a slightly stiffened gait but is not painful.  No tenderness over the his lumbar spine a little bit into the right buttock.  Strength is 5 out of 5 with resisted dorsiflexion plantarflexion of his ankles flexion extension of his legs and flexion of his hips.  Sensation is intact ? ?Imaging: ?No results found. ?No images are attached to the encounter. ? ?Labs: ?No results found for: HGBA1C, ESRSEDRATE, CRP, LABURIC, REPTSTATUS, GRAMSTAIN, CULT, LABORGA ? ? ?No results found for: ALBUMIN, PREALBUMIN, CBC ? ?No results found for: MG ?No results found for: VD25OH ? ?No results found for: PREALBUMIN ? ?  Latest Ref Rng & Units 09/29/2015  ? 11:26 AM  ?CBC EXTENDED  ?WBC 4.0 - 10.5 K/uL 6.2    ?RBC 4.22 - 5.81 MIL/uL 5.35     ?Hemoglobin 13.0 - 17.0 g/dL 87.5    ?HCT 39.0 - 52.0 % 47.6    ?Platelets 150 - 400 K/uL 246    ?NEUT# 1.7 - 7.7 K/uL 3.4    ?Lymph# 0.7 - 4.0 K/uL 2.2    ? ? ? ?There is no height or weight on file to calculate BMI. ? ?Orders:  ?No orders of the defined types were placed in this encounter. ? ?Meds ordered this encounter  ?Medications  ? methylPREDNISolone (MEDROL DOSEPAK) 4 MG TBPK tablet  ?  Sig: Take as directed  ?  Dispense:  21 tablet  ?  Refill:  0  ? ? ? Procedures: ?No procedures performed ? ?Clinical Data: ?No additional findings. ? ?ROS: ? ?All other systems negative, except as noted in the HPI. ?Review of Systems ? ?Objective: ?Vital Signs: There were no vitals taken for this visit. ? ?Specialty Comments:  ?No specialty comments available. ? ?PMFS History: ?Patient Active Problem List  ? Diagnosis Date Noted  ? Low back pain 07/24/2021  ? Hypertension 07/18/2021  ? Elevated blood pressure reading 07/04/2021  ? Type 2 diabetes mellitus without complication, without long-term current use of insulin ([facility]) 07/04/2021  ? ?Past Medical History:  ?Diagnosis Date  ?  Diabetes mellitus without complication (H[facility])   ?  ?Family History  ?Problem Relation Age of Onset  ? Diabetes Father   ? Diabetes Paternal Grandmother   ?  ?Past Surgical History:  ?Procedure Laterality Date  ? TONSILLECTOMY    ? ?Social History  ? ?Occupational History  ? Not on file  ?Tobacco Use  ? Smoking status: Never  ? Smokeless tobacco: Not on file  ?Substance and Sexual Activity  ? Alcohol use: No  ? Drug use: Not on file  ? Sexual activity: Not on file  ? ? ? ? ? ?

## 2021-08-22 NOTE — Progress Notes (Signed)
? ?Office Visit Note ?  ?Patient: Terrance Smith           ?Date of Birth: 01-30-1986           ?MRN: [id number] ?Visit Date: 08/22/2021 ?             ?Requested by: Clayborne Dana, NP ?[address] ?[address] ?[place],  [place] [address] ?PCP: Clayborne Dana, NP ? ?Chief Complaint  ?Patient presents with  ? Lower Back - Pain, Follow-up  ? ? ? ? ?HPI: ?Patient presents today for follow-up on his low back pain.  He did take a Medrol Dosepak a few weeks ago.  He reports it did not affect his blood sugars at all.  He has been working with physical therapy and feels he is doing better.  He still has some stiffness and is wondering if he could possibly do 1 more Medrol Dosepak. ? ?Assessment & Plan: ?Visit Diagnoses: Low back pain ? ?Plan: I told him I would allow him to have 1 more Medrol Dosepak.  He understands again to monitor his sugars.  If he does not get better resolution of his symptoms once he completes physical therapy he is to follow-up with me and I will order an MRI ? ?Follow-Up Instructions: No follow-ups on file.  ? ?Ortho Exam ? ?Patient is alert, oriented, no adenopathy, well-dressed, normal affect, normal respiratory effort. ?Examination he does have a somewhat antalgic gait from being stiff.  No tenderness to palpation along his spine.  Sensation is intact distally he has good strength with flexion and extension of his legs flexion and of his ankles and flexion of his hips. ? ?Imaging: ?No results found. ?No images are attached to the encounter. ? ?Labs: ?No results found for: HGBA1C, ESRSEDRATE, CRP, LABURIC, REPTSTATUS, GRAMSTAIN, CULT, LABORGA ? ? ?No results found for: ALBUMIN, PREALBUMIN, CBC ? ?No results found for: MG ?No results found for: VD25OH ? ?No results found for: PREALBUMIN ? ?  Latest Ref Rng & Units 09/29/2015  ? 11:26 AM  ?CBC EXTENDED  ?WBC 4.0 - 10.5 K/uL 6.2    ?RBC 4.22 - 5.81 MIL/uL 5.35    ?Hemoglobin 13.0 - 17.0 g/dL 83.6    ?HCT 39.0 - 52.0 % 47.6    ?Platelets 150 - 400  K/uL 246    ?NEUT# 1.7 - 7.7 K/uL 3.4    ?Lymph# 0.7 - 4.0 K/uL 2.2    ? ? ? ?There is no height or weight on file to calculate BMI. ? ?Orders:  ?No orders of the defined types were placed in this encounter. ? ?No orders of the defined types were placed in this encounter. ? ? ? Procedures: ?No procedures performed ? ?Clinical Data: ?No additional findings. ? ?ROS: ? ?All other systems negative, except as noted in the HPI. ?Review of Systems ? ?Objective: ?Vital Signs: There were no vitals taken for this visit. ? ?Specialty Comments:  ?No specialty comments available. ? ?PMFS History: ?Patient Active Problem List  ? Diagnosis Date Noted  ? Low back pain 07/24/2021  ? Hypertension 07/18/2021  ? Elevated blood pressure reading 07/04/2021  ? Type 2 diabetes mellitus without complication, without long-term current use of insulin (HCC) 07/04/2021  ? ?Past Medical History:  ?Diagnosis Date  ? Diabetes mellitus without complication ([facility])   ?  ?Family History  ?Problem Relation Age of Onset  ? Diabetes Father   ? Diabetes Paternal Grandmother   ?  ?Past Surgical History:  ?Procedure  Laterality Date  ? TONSILLECTOMY    ? ?Social History  ? ?Occupational History  ? Not on file  ?Tobacco Use  ? Smoking status: Never  ? Smokeless tobacco: Not on file  ?Substance and Sexual Activity  ? Alcohol use: No  ? Drug use: Not on file  ? Sexual activity: Not on file  ? ? ? ? ? ?

## 2021-08-25 ENCOUNTER — Other Ambulatory Visit (HOSPITAL_BASED_OUTPATIENT_CLINIC_OR_DEPARTMENT_OTHER): Payer: Self-pay

## 2021-08-25 ENCOUNTER — Other Ambulatory Visit: Payer: Self-pay | Admitting: *Deleted

## 2021-08-25 ENCOUNTER — Encounter: Payer: Self-pay | Admitting: Family Medicine

## 2021-08-25 MED ORDER — METFORMIN HCL 500 MG PO TABS
500.0000 mg | ORAL_TABLET | Freq: Two times a day (BID) | ORAL | 1 refills | Status: DC
  Filled 2021-08-25: qty 60, 30d supply, fill #0
  Filled 2021-09-29: qty 60, 30d supply, fill #1

## 2021-08-29 ENCOUNTER — Ambulatory Visit: Payer: 59 | Admitting: Physical Therapy

## 2021-09-05 ENCOUNTER — Encounter: Payer: 59 | Admitting: Physical Therapy

## 2021-09-05 ENCOUNTER — Ambulatory Visit: Payer: 59 | Admitting: Family Medicine

## 2021-09-05 ENCOUNTER — Ambulatory Visit: Payer: 59 | Admitting: Physical Therapy

## 2021-09-08 ENCOUNTER — Telehealth: Payer: Self-pay | Admitting: Physician Assistant

## 2021-09-08 ENCOUNTER — Other Ambulatory Visit: Payer: Self-pay | Admitting: Physician Assistant

## 2021-09-08 DIAGNOSIS — M5440 Lumbago with sciatica, unspecified side: Secondary | ICD-10-CM

## 2021-09-08 NOTE — Telephone Encounter (Signed)
Called and notified patient.

## 2021-09-08 NOTE — Telephone Encounter (Signed)
Pt  called requesting for a MRI referral be sent in per conversation with PA Persons. Please call pt at [phone]

## 2021-09-11 ENCOUNTER — Other Ambulatory Visit (HOSPITAL_BASED_OUTPATIENT_CLINIC_OR_DEPARTMENT_OTHER): Payer: Self-pay

## 2021-09-14 ENCOUNTER — Ambulatory Visit (HOSPITAL_COMMUNITY)
Admission: RE | Admit: 2021-09-14 | Discharge: 2021-09-14 | Disposition: A | Discharge status: Home / Self Care | Payer: 59 | Source: Ambulatory Visit | Attending: Physician Assistant | Admitting: Physician Assistant

## 2021-09-14 DIAGNOSIS — M5440 Lumbago with sciatica, unspecified side: Secondary | ICD-10-CM | POA: Insufficient documentation

## 2021-09-16 ENCOUNTER — Encounter: Payer: Self-pay | Admitting: Orthopaedic Surgery

## 2021-09-16 ENCOUNTER — Telehealth: Payer: Self-pay

## 2021-09-16 NOTE — Telephone Encounter (Signed)
Tried to call patient. No answer. Left message on machine that he can see Dr.Yates tomorrow morning at 9am. I ask him to call our office to confirm and schedule, otherwise it would have to be next week.

## 2021-09-16 NOTE — Telephone Encounter (Signed)
Tried to call patient. No answer. Left message on machine that he can see Dr.Yates tomorrow morning at 9am concerning his lower back. I ask him to call our office to confirm and schedule, otherwise it would have to be next week per Dr.Yates. Patient has also been sent a MyChart message.

## 2021-09-17 ENCOUNTER — Telehealth: Payer: Self-pay | Admitting: Orthopaedic Surgery

## 2021-09-17 ENCOUNTER — Ambulatory Visit: Payer: 59 | Admitting: Orthopaedic Surgery

## 2021-09-17 VITALS — BP 175/107 | HR 82

## 2021-09-17 DIAGNOSIS — M48062 Spinal stenosis, lumbar region with neurogenic claudication: Secondary | ICD-10-CM

## 2021-09-17 DIAGNOSIS — M5440 Lumbago with sciatica, unspecified side: Secondary | ICD-10-CM | POA: Diagnosis not present

## 2021-09-17 NOTE — Telephone Encounter (Signed)
Pt called to confirm he is on his way in to see Dr.Yates at 9 am per Margrett Rud.. Please open a slot for pt.

## 2021-09-17 NOTE — Progress Notes (Unsigned)
   Office Visit Note   Patient: Terrance Smith           Date of Birth: 08-03-1985           MRN: [id number] Visit Date: 09/17/2021              Requested by: Clayborne Dana, NP [address] [place],  [place] [address] PCP: Clayborne Dana, NP   Assessment & Plan: Visit Diagnoses: No diagnosis found.  Plan: ***  Follow-Up Instructions: No follow-ups on file.   Orders:  No orders of the defined types were placed in this encounter.  No orders of the defined types were placed in this encounter.     Procedures: No procedures performed   Clinical Data: No additional findings.   Subjective: Chief Complaint  Patient presents with   Lower Back - Pain    S/p MRi with Dr. Cleophas Dunker     HPI  Review of Systems   Objective: Vital Signs: BP (!) 175/107   Pulse 82   Physical Exam  Ortho Exam  Specialty Comments:  No specialty comments available.  Imaging: No results found.   PMFS History: Patient Active Problem List   Diagnosis Date Noted   Low back pain 07/24/2021   Hypertension 07/18/2021   Elevated blood pressure reading 07/04/2021   Type 2 diabetes mellitus without complication, without long-term current use of insulin ([facility]CC) 07/04/2021   Past Medical History:  Diagnosis Date   Diabetes mellitus without complication ([facility])     Family History  Problem Relation Age of Onset   Diabetes Father    Diabetes Paternal Grandmother     Past Surgical History:  Procedure Laterality Date   TONSILLECTOMY     Social History   Occupational History   Not on file  Tobacco Use   Smoking status: Never   Smokeless tobacco: Not on file  Substance and Sexual Activity   Alcohol use: No   Drug use: Not on file   Sexual activity: Not on file

## 2021-09-18 DIAGNOSIS — M48061 Spinal stenosis, lumbar region without neurogenic claudication: Secondary | ICD-10-CM | POA: Insufficient documentation

## 2021-09-19 ENCOUNTER — Encounter: Payer: 59 | Admitting: Physical Therapy

## 2021-09-19 ENCOUNTER — Other Ambulatory Visit (HOSPITAL_BASED_OUTPATIENT_CLINIC_OR_DEPARTMENT_OTHER): Payer: Self-pay

## 2021-09-29 ENCOUNTER — Other Ambulatory Visit (HOSPITAL_BASED_OUTPATIENT_CLINIC_OR_DEPARTMENT_OTHER): Payer: Self-pay

## 2021-09-30 ENCOUNTER — Ambulatory Visit: Payer: 59 | Admitting: Orthopaedic Surgery

## 2021-10-16 ENCOUNTER — Ambulatory Visit: Payer: 59 | Admitting: Physical Medicine and Rehabilitation

## 2021-10-16 ENCOUNTER — Encounter: Payer: Self-pay | Admitting: Physical Medicine and Rehabilitation

## 2021-10-16 ENCOUNTER — Ambulatory Visit: Payer: Self-pay

## 2021-10-16 VITALS — BP 149/88 | HR 60

## 2021-10-16 DIAGNOSIS — M5416 Radiculopathy, lumbar region: Secondary | ICD-10-CM | POA: Diagnosis not present

## 2021-10-16 MED ORDER — METHYLPREDNISOLONE ACETATE 80 MG/ML IJ SUSP
80.0000 mg | Freq: Once | INTRAMUSCULAR | Status: AC
  Administered 2021-10-16: 80 mg

## 2021-10-16 NOTE — Progress Notes (Signed)
Pt state lower back pain that travels down both legs. Pt state sitting and then having to get up and take the first step makes the pain worse. Pt state he uses heat, takes over the counter pain meds and a back brace to help ease his pain.  Numeric Pain Rating Scale and Functional Assessment Average Pain 8  In the last MONTH (on 0-10 scale) has pain interfered with the following?  1. General activity like being  able to carry out your everyday physical activities such as walking, climbing stairs, carrying groceries, or moving a chair?  Rating(10)   +Driver, -BT, -Dye Allergies.

## 2021-10-16 NOTE — Patient Instructions (Signed)

## 2021-11-06 ENCOUNTER — Other Ambulatory Visit: Payer: Self-pay | Admitting: Family Medicine

## 2021-11-06 DIAGNOSIS — I10 Essential (primary) hypertension: Secondary | ICD-10-CM

## 2021-11-07 ENCOUNTER — Other Ambulatory Visit (HOSPITAL_BASED_OUTPATIENT_CLINIC_OR_DEPARTMENT_OTHER): Payer: Self-pay

## 2021-11-07 MED ORDER — LOSARTAN POTASSIUM 50 MG PO TABS
ORAL_TABLET | ORAL | 1 refills | Status: DC
  Filled 2021-11-07: qty 30, 30d supply, fill #0
  Filled 2021-12-18: qty 30, 30d supply, fill #1

## 2021-11-07 MED ORDER — METFORMIN HCL 500 MG PO TABS
500.0000 mg | ORAL_TABLET | Freq: Two times a day (BID) | ORAL | 1 refills | Status: DC
  Filled 2021-11-07: qty 60, 30d supply, fill #0
  Filled 2021-12-18: qty 60, 30d supply, fill #1

## 2021-11-11 NOTE — Progress Notes (Signed)
Bauer Ausborn - 36 y.o. male MRN [id number]  Date of birth: 10-Apr-1986  Office Visit Note: Visit Date: 10/16/2021 PCP: Clayborne Dana, NP Referred by: Clayborne Dana, NP  Subjective: Chief Complaint  Patient presents with   Lower Back - Pain   HPI:  Ramiro Pangilinan is a 37 y.o. male who comes in today at the request of Dr. Annell Greening for planned Midline L4-5 Lumbar Interlaminar epidural steroid injection with fluoroscopic guidance.  The patient has failed conservative care including home exercise, medications, time and activity modification.  This injection will be diagnostic and hopefully therapeutic.  Please see requesting physician notes for further details and justification.   ROS Otherwise per HPI.  Assessment & Plan: Visit Diagnoses:    ICD-10-CM   1. Lumbar radiculopathy  M54.16 XR C-ARM NO REPORT    methylPREDNISolone acetate (DEPO-MEDROL) injection 80 mg    Epidural Steroid injection      Plan: No additional findings.   Meds & Orders:  Meds ordered this encounter  Medications   methylPREDNISolone acetate (DEPO-MEDROL) injection 80 mg    Orders Placed This Encounter  Procedures   XR C-ARM NO REPORT   Epidural Steroid injection    Follow-up: Return for visit to requesting provider as needed.   Procedures: No procedures performed  Lumbar Epidural Steroid Injection - Interlaminar Approach with Fluoroscopic Guidance  Patient: Duell Holdren      Date of Birth: 06-17-1985 MRN: [id number] PCP: Clayborne Dana, NP      Visit Date: 10/16/2021   Universal Protocol:     Consent Given By: the patient  Position: PRONE  Additional Comments: Vital signs were monitored before and after the procedure. Patient was prepped and draped in the usual sterile fashion. The correct patient, procedure, and site was verified.   Injection Procedure Details:   Procedure diagnoses: Lumbar radiculopathy [M54.16]   Meds Administered:  Meds ordered this encounter  Medications    methylPREDNISolone acetate (DEPO-MEDROL) injection 80 mg     Laterality: Midline  Location/Site:  L4-5  Needle: 3.5 in., 20 ga. Tuohy  Needle Placement: Paramedian epidural  Findings:   -Comments: Excellent flow of contrast into the epidural space.  Procedure Details: Using a paramedian approach from the side mentioned above, the region overlying the inferior lamina was localized under fluoroscopic visualization and the soft tissues overlying this structure were infiltrated with 4 ml. of 1% Lidocaine without Epinephrine. The Tuohy needle was inserted into the epidural space using a paramedian approach.   The epidural space was localized using loss of resistance along with counter oblique bi-planar fluoroscopic views.  After negative aspirate for air, blood, and CSF, a 2 ml. volume of Isovue-250 was injected into the epidural space and the flow of contrast was observed. Radiographs were obtained for documentation purposes.    The injectate was administered into the level noted above.   Additional Comments:  The patient tolerated the procedure well Dressing: 2 x 2 sterile gauze and Band-Aid    Post-procedure details: Patient was observed during the procedure. Post-procedure instructions were reviewed.  Patient left the clinic in stable condition.   Clinical History: MRI LUMBAR SPINE WITHOUT CONTRAST   TECHNIQUE: Multiplanar, multisequence MR imaging of the lumbar spine was performed. No intravenous contrast was administered.   COMPARISON:  Radiograph dated July 04, 2021   FINDINGS: Segmentation:   5 non rib-bearing lumbar type vertebral bodies are present. The lowest fully formed vertebral body is L5.   Alignment:  Physiologic.  Vertebrae:  No fracture, evidence of discitis, or bone lesion.   Conus medullaris and cauda equina: Conus extends to the T12 level. Conus and cauda equina appear normal.   Paraspinal and other soft tissues: Negative.   Disc spaces:    T12-L1: No significant disc bulge. No neural foraminal stenosis. No central canal stenosis.   L1-L2: Moderate broad-based disc bulge with narrowing of lateral recess bilaterally. No significant neural foraminal stenosis. Mild facet joint arthropathy.   L2-L3: Moderate to severe asymmetric disc protrusion to the right with marked narrowing of the right lateral recess and encroachment of the descending nerve roots. Mild-to-moderate left lateral recess stenosis. No significant neural foraminal stenosis.   L3-L4: Moderate size central disc protrusion with spinal canal stenosis. Mild left neural foraminal stenosis. Mild facet joint arthropathy.   L4-L5: Severe broad-based disc bulge into the spinal canal with moderate stenosis. Marked stenosis of the bilateral lateral recesses. Mild bilateral neural foraminal stenosis.   L5-S1: Asymmetric disc protrusion to the left with marked stenosis of the left lateral recess. Moderate left neural foraminal stenosis. Right lateral recess stenosis without significant right neural foraminal stenosis. Mild bilateral facet joint arthropathy   IMPRESSION: 1.  No evidence of acute fracture or subluxation.   2. 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     Electronically Signed   By: Larose Hires D.O.   On: 09/16/2021 09:09     Objective:  VS:  HT:    WT:   BMI:     BP:(!) 149/88  HR:60bpm  TEMP: ( )  RESP:  Physical Exam Vitals and nursing note reviewed.  Constitutional:      General: He is not in acute distress.    Appearance: Normal appearance. He is not ill-appearing.  HENT:     Head: Normocephalic and atraumatic.     Right Ear: External ear normal.     Left Ear: External ear normal.     Nose: No congestion.  Eyes:     Extraocular Movements: Extraocular movements intact.  Cardiovascular:     Rate and Rhythm: Normal rate.     Pulses: Normal pulses.  Pulmonary:     Effort: Pulmonary effort is  normal. No respiratory distress.  Abdominal:     General: There is no distension.     Palpations: Abdomen is soft.  Musculoskeletal:        General: No tenderness or signs of injury.     Cervical back: Neck supple.     Right lower leg: No edema.     Left lower leg: No edema.     Comments: Patient has good distal strength without clonus.  Skin:    Findings: No erythema or rash.  Neurological:     General: No focal deficit present.     Mental Status: He is alert and oriented to person, place, and time.     Sensory: No sensory deficit.     Motor: No weakness or abnormal muscle tone.     Coordination: Coordination normal.  Psychiatric:        Mood and Affect: Mood normal.        Behavior: Behavior normal.      Imaging: No results found.

## 2021-11-11 NOTE — Procedures (Signed)
Lumbar Epidural Steroid Injection - Interlaminar Approach with Fluoroscopic Guidance  Patient: Terrance Smith      Date of Birth: 02-Mar-1986 MRN: [id number] PCP: Clayborne Dana, NP      Visit Date: 10/16/2021   Universal Protocol:     Consent Given By: the patient  Position: PRONE  Additional Comments: Vital signs were monitored before and after the procedure. Patient was prepped and draped in the usual sterile fashion. The correct patient, procedure, and site was verified.   Injection Procedure Details:   Procedure diagnoses: Lumbar radiculopathy [M54.16]   Meds Administered:  Meds ordered this encounter  Medications   methylPREDNISolone acetate (DEPO-MEDROL) injection 80 mg     Laterality: Midline  Location/Site:  L4-5  Needle: 3.5 in., 20 ga. Tuohy  Needle Placement: Paramedian epidural  Findings:   -Comments: Excellent flow of contrast into the epidural space.  Procedure Details: Using a paramedian approach from the side mentioned above, the region overlying the inferior lamina was localized under fluoroscopic visualization and the soft tissues overlying this structure were infiltrated with 4 ml. of 1% Lidocaine without Epinephrine. The Tuohy needle was inserted into the epidural space using a paramedian approach.   The epidural space was localized using loss of resistance along with counter oblique bi-planar fluoroscopic views.  After negative aspirate for air, blood, and CSF, a 2 ml. volume of Isovue-250 was injected into the epidural space and the flow of contrast was observed. Radiographs were obtained for documentation purposes.    The injectate was administered into the level noted above.   Additional Comments:  The patient tolerated the procedure well Dressing: 2 x 2 sterile gauze and Band-Aid    Post-procedure details: Patient was observed during the procedure. Post-procedure instructions were reviewed.  Patient left the clinic in stable condition.

## 2021-12-18 ENCOUNTER — Other Ambulatory Visit (HOSPITAL_BASED_OUTPATIENT_CLINIC_OR_DEPARTMENT_OTHER): Payer: Self-pay

## 2022-01-30 ENCOUNTER — Other Ambulatory Visit: Payer: Self-pay | Admitting: Family Medicine

## 2022-01-30 ENCOUNTER — Other Ambulatory Visit (HOSPITAL_BASED_OUTPATIENT_CLINIC_OR_DEPARTMENT_OTHER): Payer: Self-pay

## 2022-01-30 DIAGNOSIS — I10 Essential (primary) hypertension: Secondary | ICD-10-CM

## 2022-01-30 MED ORDER — METFORMIN HCL 500 MG PO TABS
500.0000 mg | ORAL_TABLET | Freq: Two times a day (BID) | ORAL | 0 refills | Status: DC
  Filled 2022-01-30: qty 180, 90d supply, fill #0

## 2022-01-30 MED ORDER — LOSARTAN POTASSIUM 50 MG PO TABS
50.0000 mg | ORAL_TABLET | Freq: Every day | ORAL | 0 refills | Status: DC
  Filled 2022-01-30: qty 90, 90d supply, fill #0

## 2022-03-06 ENCOUNTER — Ambulatory Visit: Payer: Self-pay | Admitting: Orthopaedic Surgery

## 2022-03-16 ENCOUNTER — Ambulatory Visit: Payer: Self-pay | Admitting: Family Medicine

## 2023-03-04 ENCOUNTER — Encounter: Payer: Self-pay | Admitting: Family Medicine

## 2023-03-09 ENCOUNTER — Encounter: Payer: Self-pay | Admitting: Family Medicine

## 2023-03-09 ENCOUNTER — Ambulatory Visit: Payer: Managed Care, Other (non HMO) | Admitting: Family Medicine

## 2023-03-09 ENCOUNTER — Ambulatory Visit (INDEPENDENT_AMBULATORY_CARE_PROVIDER_SITE_OTHER): Payer: Managed Care, Other (non HMO) | Admitting: Family Medicine

## 2023-03-09 VITALS — BP 123/76 | HR 69 | Ht 78.0 in | Wt 241.0 lb

## 2023-03-09 DIAGNOSIS — F419 Anxiety disorder, unspecified: Secondary | ICD-10-CM

## 2023-03-09 DIAGNOSIS — G834 Cauda equina syndrome: Secondary | ICD-10-CM

## 2023-03-09 DIAGNOSIS — Z7984 Long term (current) use of oral hypoglycemic drugs: Secondary | ICD-10-CM

## 2023-03-09 DIAGNOSIS — I10 Essential (primary) hypertension: Secondary | ICD-10-CM | POA: Diagnosis not present

## 2023-03-09 DIAGNOSIS — F32A Depression, unspecified: Secondary | ICD-10-CM | POA: Insufficient documentation

## 2023-03-09 DIAGNOSIS — Z09 Encounter for follow-up examination after completed treatment for conditions other than malignant neoplasm: Secondary | ICD-10-CM

## 2023-03-09 DIAGNOSIS — E119 Type 2 diabetes mellitus without complications: Secondary | ICD-10-CM

## 2023-03-09 MED ORDER — AMLODIPINE BESYLATE 10 MG PO TABS: 10.0000 mg | ORAL_TABLET | Freq: Every day | ORAL | 1 refills | Status: AC

## 2023-03-09 MED ORDER — TAMSULOSIN HCL 0.4 MG PO CAPS: 0.4000 mg | ORAL_CAPSULE | Freq: Every day | ORAL | 1 refills | Status: AC

## 2023-03-09 MED ORDER — LOSARTAN POTASSIUM 50 MG PO TABS: 50.0000 mg | ORAL_TABLET | Freq: Every day | ORAL | 0 refills | Status: DC

## 2023-03-09 MED ORDER — SERTRALINE HCL 50 MG PO TABS: 50.0000 mg | ORAL_TABLET | Freq: Every day | ORAL | 3 refills | Status: DC

## 2023-03-09 MED ORDER — METFORMIN HCL 500 MG PO TABS: 500.0000 mg | ORAL_TABLET | Freq: Two times a day (BID) | ORAL | 0 refills | Status: AC

## 2023-03-09 MED ORDER — CARVEDILOL 6.25 MG PO TABS: 6.2500 mg | ORAL_TABLET | Freq: Two times a day (BID) | ORAL | 1 refills | Status: AC

## 2023-03-09 NOTE — Assessment & Plan Note (Signed)
-  Continue self-cathing four times daily. Following with urology. Taking flomax

## 2023-03-09 NOTE — Assessment & Plan Note (Signed)
Blood pressure controlled on current medications (Amlodipine, Carvedilol, Losartan). -Continue current medications. -Check blood pressure at home  -Refill prescriptions as needed at the preferred pharmacy.

## 2023-03-09 NOTE — Assessment & Plan Note (Signed)
Continue metformin and lifestyle measures Glucose has been stable

## 2023-03-09 NOTE — Progress Notes (Signed)
Acute Office Visit  Subjective:     Patient ID: Terrance Smith, male    DOB: 1985/05/27, 37 y.o.   MRN: [id number]  Chief Complaint  Patient presents with   Medical Management of Chronic Issues    HPI Patient is in today for hospital follow-up.   Discussed the use of AI scribe software for clinical note transcription with the patient, who gave verbal consent to proceed.  History of Present Illness   The patient, with a history of hypertension and diabetes, presented for a follow-up appointment after a recent hospitalization for severe spinal stenosis at L4 and L5 with compression of the cauda equina nerve roots. The patient initially experienced sudden onset of low back pain, numbness in the genital area, and loss of bladder control while performing routine activities. The symptoms were severe enough to cause urinary incontinence.  The patient underwent a laminectomy and discectomy, followed by a second surgery for L2 and L3 decompression. Postoperatively, the patient experienced urinary retention requiring foley and now self-catheterization four times a day. The patient also reported persistent numbness in the feet, buttocks, and genital area, although this has improved somewhat since the surgeries.  The patient has been participating in physical therapy once a week and is awaiting a referral for pelvic floor physical therapy. Despite these interventions, the patient reported occasional bowel incontinence and ongoing difficulty with ambulation due to uncertainty about foot placement.  The patient also reported mood fluctuations, including periods of feeling down and occasional crying spells. This emotional distress appears to be related to the significant lifestyle changes and physical limitations following the recent health events.  The patient's blood pressure has been variable, with some high readings noted during physical therapy sessions. The patient is currently on amlodipine,  carvedilol, and losartan for hypertension, and metformin for diabetes. The patient also reported taking ibuprofen and hydrocodone for pain management, with the latter being used sparingly. The patient's pain is primarily located in the lower back and both hips.           ROS All review of systems negative except what is listed in the HPI      Objective:    BP 123/76   Pulse 69   Ht 6\' 6"  (1.981 m)   Wt 241 lb (109.3 kg)   SpO2 100%   BMI 27.85 kg/m    Physical Exam Vitals reviewed.  Constitutional:      General: He is not in acute distress.    Appearance: Normal appearance. He is not ill-appearing.  Cardiovascular:     Rate and Rhythm: Normal rate and regular rhythm.  Pulmonary:     Effort: Pulmonary effort is normal.     Breath sounds: Normal breath sounds.  Musculoskeletal:     Comments: Purposeful movement in all extremities; using rolling walker  Skin:    Comments: Healing incisions to lumbar spine  Neurological:     Mental Status: He is alert and oriented to person, place, and time.  Psychiatric:        Mood and Affect: Mood normal.        Behavior: Behavior normal.        Thought Content: Thought content normal.        Judgment: Judgment normal.     No results found for any visits on 03/09/23.      Assessment & Plan:   Problem List Items Addressed This Visit       Active Problems   Type 2 diabetes mellitus  without complication, without long-term current use of insulin (HCC)    Continue metformin and lifestyle measures Glucose has been stable        Relevant Medications   losartan (COZAAR) 50 MG tablet   metFORMIN (GLUCOPHAGE) 500 MG tablet   Hypertension    Blood pressure controlled on current medications (Amlodipine, Carvedilol, Losartan). -Continue current medications. -Check blood pressure at home  -Refill prescriptions as needed at the preferred pharmacy.      Relevant Medications   amLODipine (NORVASC) 10 MG tablet   carvedilol  (COREG) 6.25 MG tablet   losartan (COZAAR) 50 MG tablet   Cauda equina syndrome (HCC) - Primary    Recent hospitalization with sudden onset of lower back pain, numbness in the genital area, and loss of bladder control. Underwent laminectomy and discectomy at L4 and L5, followed by L2 and L3 decompression. Currently experiencing residual numbness in the feet, buttocks, and genital area, and self-cathing for urinary retention. -Continue with current physical therapy regimen once a week. -Adding pelvic floor physical therapy  -Continue self-cathing four times daily. Following with urology. Taking flomax -Return to neurosurgery for follow-up as scheduled on 03/23/2023.      Relevant Medications   traZODone (DESYREL) 50 MG tablet   sertraline (ZOLOFT) 50 MG tablet   Anxiety and depression    Reports mood swings, feelings of failure, and occasional crying. Expressed interest in both counseling and medication. -Start Sertraline (Zoloft), beginning with half a tablet daily for two weeks, then increasing to a full tablet daily. -Referral to counseling -Follow-up appointment in six weeks to assess mood and medication effects.      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 Following with urology. Taking flomax      Relevant Medications   traZODone (DESYREL) 50 MG tablet   sertraline (ZOLOFT) 50 MG tablet   tamsulosin (FLOMAX) 0.4 MG CAPS capsule   Other Visit Diagnoses     Hospital discharge follow-up           Meds ordered this encounter  Medications   sertraline (ZOLOFT) 50 MG tablet    Sig: Take 1 tablet (50 mg total) by mouth daily.    Dispense:  30 tablet    Refill:  3    Order Specific Question:   Supervising Provider    Answer:   Danise Edge A [[id number]]    amLODipine (NORVASC) 10 MG tablet    Sig: Take 1 tablet (10 mg total) by mouth daily.    Dispense:  90 tablet    Refill:  1    Order Specific Question:   Supervising Provider    Answer:   Danise Edge A [[id number]]   carvedilol (COREG) 6.25 MG tablet    Sig: Take 1 tablet (6.25 mg total) by mouth 2 (two) times daily with a meal.    Dispense:  180 tablet    Refill:  1    Order Specific Question:   Supervising Provider    Answer:   Danise Edge A [[id number]]   losartan (COZAAR) 50 MG tablet    Sig: Take 1 tablet (50 mg total) by mouth daily.    Dispense:  90 tablet    Refill:  0    Order Specific Question:   Supervising Provider  Answer:   Danise Edge A [[id number]]   metFORMIN (GLUCOPHAGE) 500 MG tablet    Sig: Take 1 tablet (500 mg total) by mouth 2 (two) times daily with a meal.    Dispense:  180 tablet    Refill:  0    Order Specific Question:   Supervising Provider    Answer:   Danise Edge A [[id number]]   tamsulosin (FLOMAX) 0.4 MG CAPS capsule    Sig: Take 1 capsule (0.4 mg total) by mouth daily.    Dispense:  90 capsule    Refill:  1    Order Specific Question:   Supervising Provider    Answer:   Danise Edge A [[id number]]    Return in about 6 weeks (around 04/20/2023) for mood f/u.  Clayborne Dana, NP

## 2023-03-09 NOTE — Patient Instructions (Signed)
Taking the medicine as directed and not missing any doses is one of the best things you can do to treat your anxiety/depression.  Here are some things to keep in mind: Side effects (stomach upset, some increased anxiety) may happen before you notice a benefit.  These side effects typically go away over time. Changes to your dose of medicine or a change in medication all together is sometimes necessary Many people will notice an improvement within two weeks but the full effect of the medication can take up to 4-6 weeks Stopping the medication when you start feeling better often results in a return of symptoms. Most people need to be on medication at least 6-12 months If you start having thoughts of hurting yourself or others after starting this medicine, please call me immediately.    

## 2023-03-09 NOTE — Assessment & Plan Note (Signed)
Reports mood swings, feelings of failure, and occasional crying. Expressed interest in both counseling and medication. -Start Sertraline (Zoloft), beginning with half a tablet daily for two weeks, then increasing to a full tablet daily. -Referral to counseling -Follow-up appointment in six weeks to assess mood and medication effects.

## 2023-03-09 NOTE — Assessment & Plan Note (Signed)
Recent hospitalization with sudden onset of lower back pain, numbness in the genital area, and loss of bladder control. Underwent laminectomy and discectomy at L4 and L5, followed by L2 and L3 decompression. Currently experiencing residual numbness in the feet, buttocks, and genital area, and self-cathing for urinary retention. -Continue with current physical therapy regimen once a week. -Adding pelvic floor physical therapy  -Continue self-cathing four times daily. Following with urology. Taking flomax -Return to neurosurgery for follow-up as scheduled on 03/23/2023.

## 2023-04-19 ENCOUNTER — Encounter: Payer: Self-pay | Admitting: Family Medicine

## 2023-04-23 ENCOUNTER — Ambulatory Visit (INDEPENDENT_AMBULATORY_CARE_PROVIDER_SITE_OTHER): Payer: Managed Care, Other (non HMO) | Admitting: Family Medicine

## 2023-04-23 ENCOUNTER — Encounter: Payer: Self-pay | Admitting: Family Medicine

## 2023-04-23 VITALS — BP 136/65 | HR 66 | Ht 78.0 in | Wt 263.0 lb

## 2023-04-23 DIAGNOSIS — Z7984 Long term (current) use of oral hypoglycemic drugs: Secondary | ICD-10-CM

## 2023-04-23 DIAGNOSIS — G4700 Insomnia, unspecified: Secondary | ICD-10-CM

## 2023-04-23 DIAGNOSIS — E119 Type 2 diabetes mellitus without complications: Secondary | ICD-10-CM

## 2023-04-23 DIAGNOSIS — R748 Abnormal levels of other serum enzymes: Secondary | ICD-10-CM

## 2023-04-23 DIAGNOSIS — G834 Cauda equina syndrome: Secondary | ICD-10-CM | POA: Diagnosis not present

## 2023-04-23 DIAGNOSIS — F32A Depression, unspecified: Secondary | ICD-10-CM

## 2023-04-23 DIAGNOSIS — F419 Anxiety disorder, unspecified: Secondary | ICD-10-CM

## 2023-04-23 LAB — COMPREHENSIVE METABOLIC PANEL
ALT: 65 U/L — ABNORMAL HIGH (ref 0–53)
AST: 43 U/L — ABNORMAL HIGH (ref 0–37)
Albumin: 4.1 g/dL (ref 3.5–5.2)
Alkaline Phosphatase: 75 U/L (ref 39–117)
BUN: 18 mg/dL (ref 6–23)
CO2: 28 meq/L (ref 19–32)
Calcium: 9.2 mg/dL (ref 8.4–10.5)
Chloride: 101 meq/L (ref 96–112)
Creatinine, Ser: 0.96 mg/dL (ref 0.40–1.50)
GFR: 102.31 mL/min (ref >60.00)
Glucose, Bld: 194 mg/dL — ABNORMAL HIGH (ref 70–99)
Potassium: 4.5 meq/L (ref 3.5–5.1)
Sodium: 139 meq/L (ref 135–145)
Total Bilirubin: 0.5 mg/dL (ref 0.2–1.2)
Total Protein: 6.9 g/dL (ref 6.0–8.3)

## 2023-04-23 LAB — HEMOGLOBIN A1C: Hgb A1c MFr Bld: 6.6 % — ABNORMAL HIGH (ref 4.6–6.5)

## 2023-04-23 MED ORDER — TRAZODONE HCL 50 MG PO TABS
50.0000 mg | ORAL_TABLET | Freq: Every evening | ORAL | 2 refills | Status: AC | PRN
Start: 2023-04-23 — End: ?

## 2023-04-23 MED ORDER — HYDROCODONE-ACETAMINOPHEN 5-325 MG PO TABS: 1.0000 | ORAL_TABLET | Freq: Four times a day (QID) | ORAL | 0 refills | Status: AC | PRN

## 2023-04-23 MED ORDER — SERTRALINE HCL 50 MG PO TABS: 50.0000 mg | ORAL_TABLET | Freq: Every day | ORAL | 3 refills | Status: AC

## 2023-04-23 NOTE — Assessment & Plan Note (Signed)
 Continue metformin and lifestyle measures Labs today

## 2023-04-23 NOTE — Assessment & Plan Note (Signed)
 Improved symptoms on Zoloft 50mg  daily. No suicidal ideation. -Continue Zoloft 50mg  daily.

## 2023-04-23 NOTE — Assessment & Plan Note (Signed)
 Occasional use of Trazodone. -Refill Trazodone as needed for sleep.

## 2023-04-23 NOTE — Progress Notes (Signed)
 Established Patient Office Visit  Subjective   Patient ID: Terrance Smith, male    DOB: [birth year]  Age: 38 y.o. MRN: [id number]  Chief Complaint  Patient presents with   Medical Management of Chronic Issues    HPI Discussed the use of AI scribe software for clinical note transcription with the patient, who gave verbal consent to proceed.  History of Present Illness   The patient, on a regimen of Zoloft  50mg , reports satisfaction with the medication and denies any side effects. They note a significant improvement in their mood. They deny any thoughts or plans of self-harm.  They have been monitoring their blood pressure at home, which has been within normal limits. However, they note that their blood pressure was slightly elevated during the visit, which they attribute to recent physical therapy.  The patient has been taking Trazodone  as needed for sleep and reports needing a refill. They also report intermittent back pain, rating it at a 6 or 7 on the pain scale. They have been managing the pain with ibuprofen but find it insufficient. They express a desire to avoid narcotics and have been previously prescribed hydrocodone , which they found effective but caused constipation.  The patient has a history of neurosurgery and is scheduled for a follow-up appointment. They are also undergoing physical therapy and report progress, now being able to walk with a cane. They are awaiting the return of sensation, presumably due to a previous neurological condition.  The patient is also on amlodipine , and they report having a six-month supply. They have been managing their medications through a local pharmacy and are due for a refill.        Hypertension: - Medications: coreg  6.125 mg BID, losartan  50 mg daily, amlodipine  10 mg daily - Compliance: good - Checking BP at home: yes, controlled - Denies any SOB, recurrent headaches, CP, vision changes, LE edema, dizziness, palpitations, or medication  side effects.   Diabetes: - Checking glucose at home: no - Medications: metformin  500 mg BID - Compliance: good - Diet: low carb - Exercise: regular physical therapy - Eye exam: deferred - Foot exam: deferred - Microalbumin: unable to void today, defer - Denies symptoms of hypoglycemia, polyuria, polydipsia, numbness extremities, foot ulcers/trauma, wounds that are not healing, medication side effects    Mood follow-up: - Diagnosis: anxiety/depression, insomnia - Treatment: Zoloft  50 mg daily, PRN trazodone  - Medication side effects: none - SI/HI: no - Update:Doing well; would like to stay on current regimen.        ROS All review of systems negative except what is listed in the HPI    Objective:     BP 136/65   Pulse 66   Ht 6' 6 (1.981 m)   Wt 263 lb (119.3 kg)   SpO2 100%   BMI 30.39 kg/m    Physical Exam Vitals reviewed.  Constitutional:      General: He is not in acute distress.    Appearance: Normal appearance. He is not ill-appearing.  Cardiovascular:     Rate and Rhythm: Normal rate and regular rhythm.  Pulmonary:     Effort: Pulmonary effort is normal.     Breath sounds: Normal breath sounds.  Musculoskeletal:     Comments: Purposeful movement in all extremities; using rolling walker  Neurological:     Mental Status: He is alert and oriented to person, place, and time.  Psychiatric:        Mood and Affect: Mood normal.  Behavior: Behavior normal.        Thought Content: Thought content normal.        Judgment: Judgment normal.      No results found for any visits on 04/23/23.    The ASCVD Risk score (Arnett DK, et al., 2019) failed to calculate for the following reasons:   The 2019 ASCVD risk score is only valid for ages 93 to 31    Assessment & Plan:   Problem List Items Addressed This Visit       Active Problems   Type 2 diabetes mellitus without complication, without long-term current use of insulin  (HCC) - Primary    Continue metformin  and lifestyle measures Labs today       Relevant Orders   Hemoglobin A1c   Comprehensive metabolic panel   Anxiety and depression   Improved symptoms on Zoloft  50mg  daily. No suicidal ideation. -Continue Zoloft  50mg  daily.       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 -Advise patient to discuss further pain management with neuro/spine specialist at upcoming appointment.      Relevant Medications   sertraline  (ZOLOFT ) 50 MG tablet   traZODone  (DESYREL ) 50 MG tablet   HYDROcodone -acetaminophen  (NORCO/VICODIN) 5-325 MG tablet   Insomnia   Occasional use of Trazodone . -Refill Trazodone  as needed for sleep.      Relevant Medications   traZODone  (DESYREL ) 50 MG tablet    Return in about 3 months (around 07/22/2023) for routine follow-up.    Waddell KATHEE Mon, NP

## 2023-04-23 NOTE — Assessment & Plan Note (Signed)
-  Provide limited supply of Hydrocodone for severe pain episodes. -Advise patient to discuss further pain management with neuro/spine specialist at upcoming appointment.

## 2023-04-26 NOTE — Addendum Note (Signed)
 Addended by: Hyman Hopes B on: 04/26/2023 08:45 PM   Modules accepted: Orders

## 2023-04-26 NOTE — Progress Notes (Signed)
 A1c is mildly elevated. Continue metformin, low-carb diet, and exercise as able. Recheck in 6 months.   Liver enzymes are mildly elevated. Let's recheck a fasting level in 2 weeks to see if accurate.

## 2023-07-22 ENCOUNTER — Ambulatory Visit: Payer: Managed Care, Other (non HMO) | Admitting: Family Medicine

## 2023-07-22 DIAGNOSIS — E119 Type 2 diabetes mellitus without complications: Secondary | ICD-10-CM

## 2023-07-22 DIAGNOSIS — R748 Abnormal levels of other serum enzymes: Secondary | ICD-10-CM

## 2023-07-22 IMAGING — MR MR LUMBAR SPINE W/O CM
5 series · 31 of 48 positions shown · non-contrast
Comparison: Radiograph dated July 04, 2021

CLINICAL DATA: Lumbar radiculopathy. Symptoms persist with greater
than 6 weeks treatment. Acute right-sided low back pain with
sciatica.

EXAM:
MRI LUMBAR SPINE WITHOUT CONTRAST
TECHNIQUE: Multiplanar, multisequence MR imaging of the lumbar spine was
performed. No intravenous contrast was administered.

[Series 5: T1 · sagittal · 4.0mm · 0.88mm/px · 6 of 17 slices shown (1 of 2)]
[im 1/17]
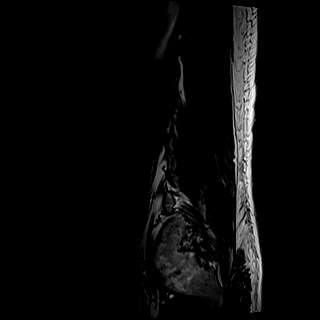
[im 4/17]
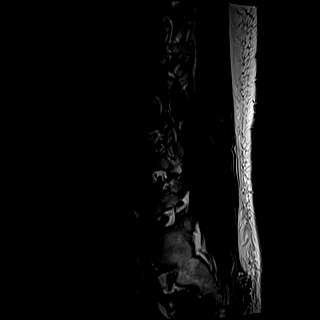
[im 7/17]
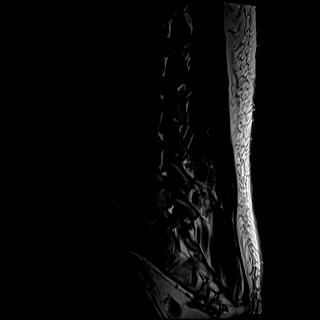
[im 10/17]
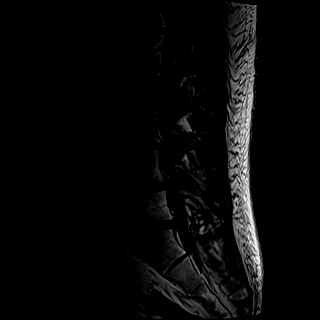
[im 13/17]
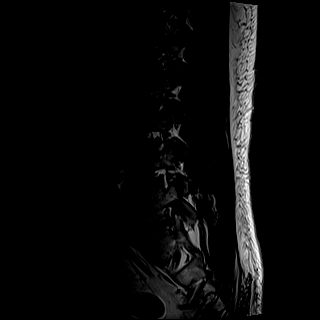
[im 17/17]
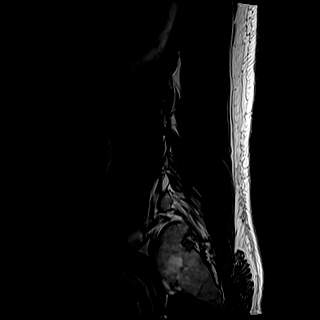

[Series 6: T2 · sagittal · 4.0mm · 0.81mm/px · 6 of 17 slices shown (1 of 2)]
[im 1/17]
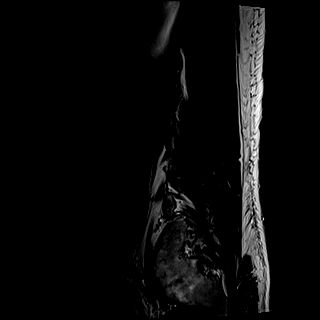
[im 4/17]
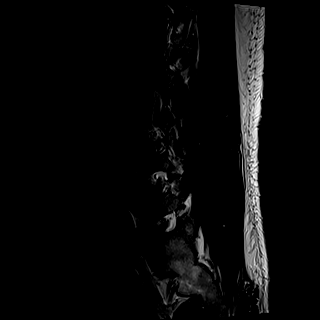
[im 7/17]
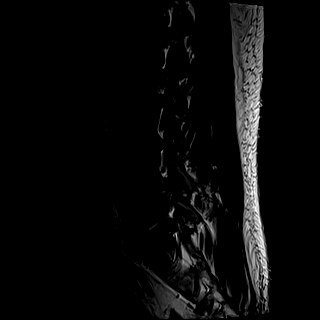
[im 10/17]
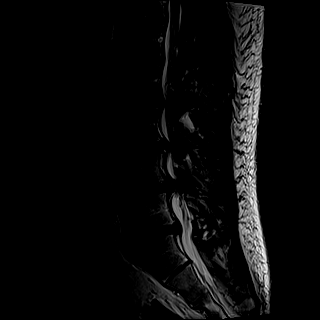
[im 13/17]
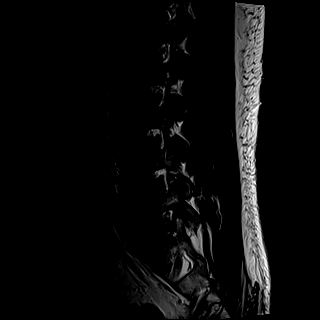
[im 17/17]
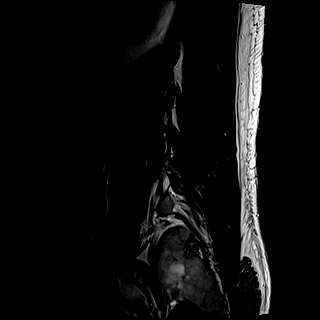

[Series 7: STIR · sagittal · 4.0mm · 0.51mm/px · 1 of 17 slices shown]
[im 1/17]
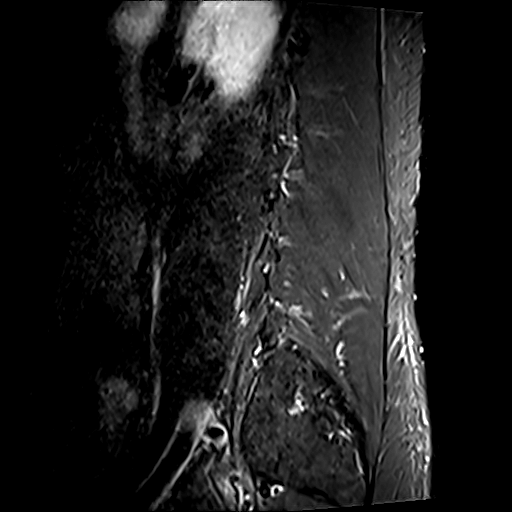

[Series 8: T2 · axial · 4.0mm · 0.62mm/px · z∈[-24,+196]mm · 9 of 45 slices shown (2 of 2)]
[im 1/45]
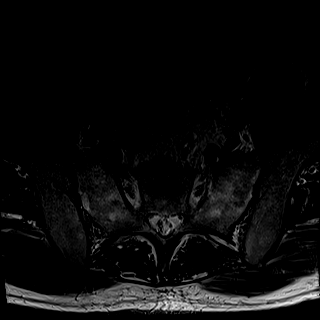
[im 7/45]
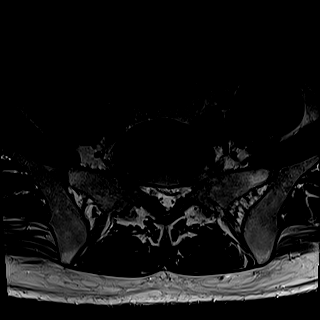
[im 13/45]
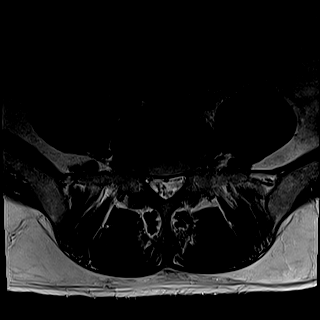
[im 19/45]
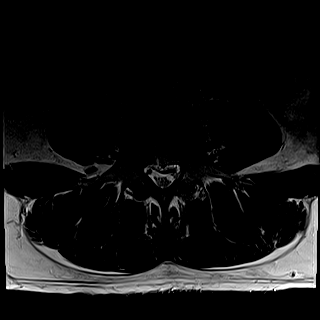
[im 23/45]
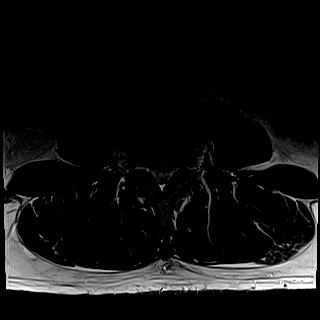
[im 26/45]
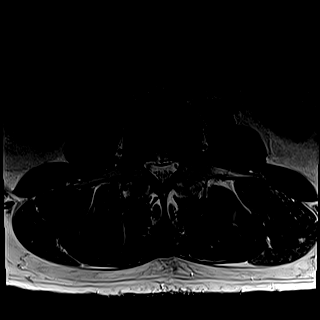
[im 32/45]
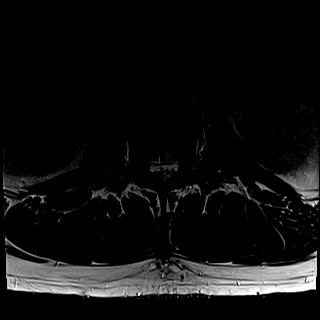
[im 38/45]
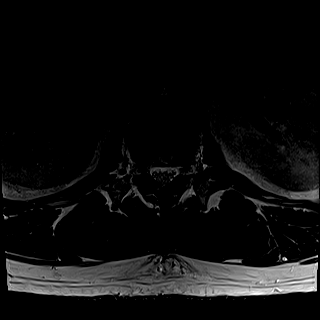
[im 45/45]
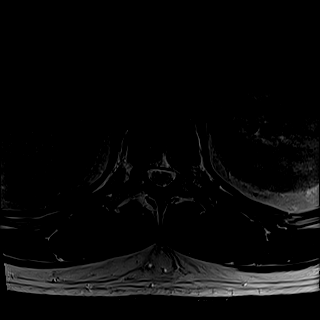

[Series 9: T1 · axial · 4.0mm · 0.39mm/px · z∈[-24,+196]mm · 9 of 45 slices shown (2 of 2)]
[im 1/45]
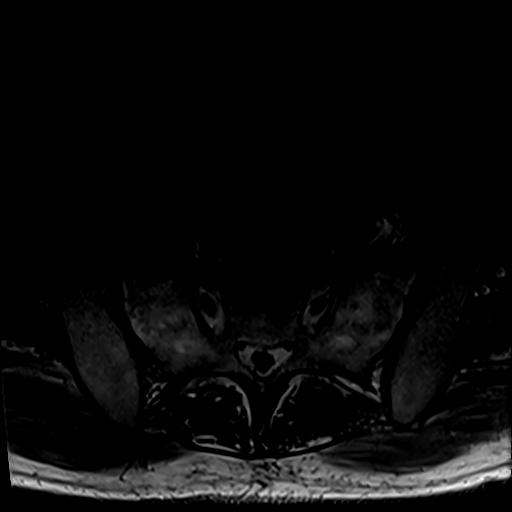
[im 7/45]
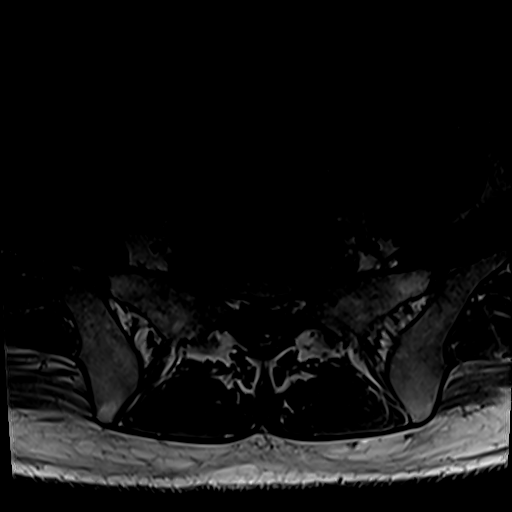
[im 13/45]
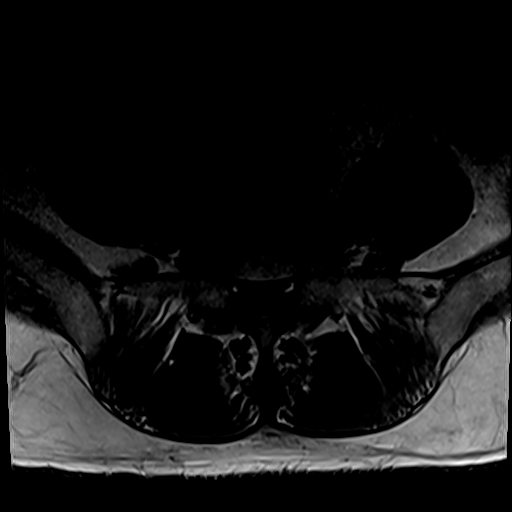
[im 19/45]
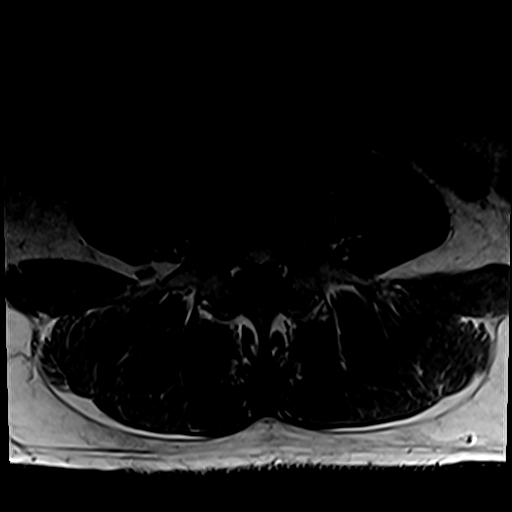
[im 23/45]
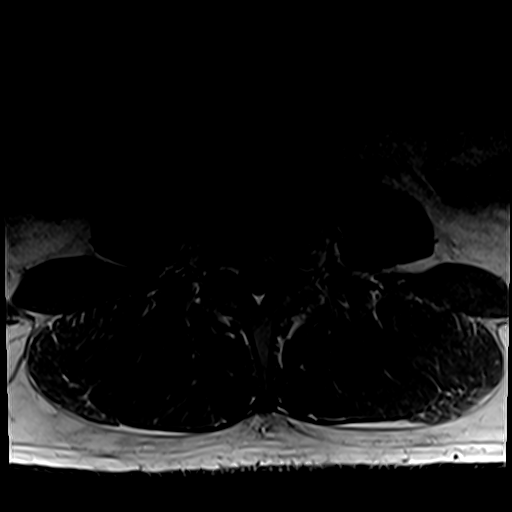
[im 26/45]
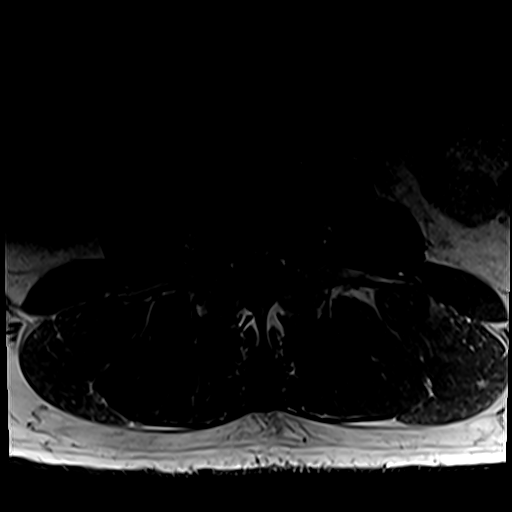
[im 32/45]
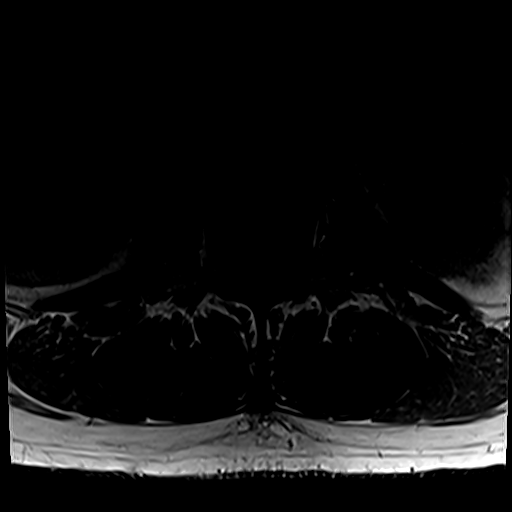
[im 38/45]
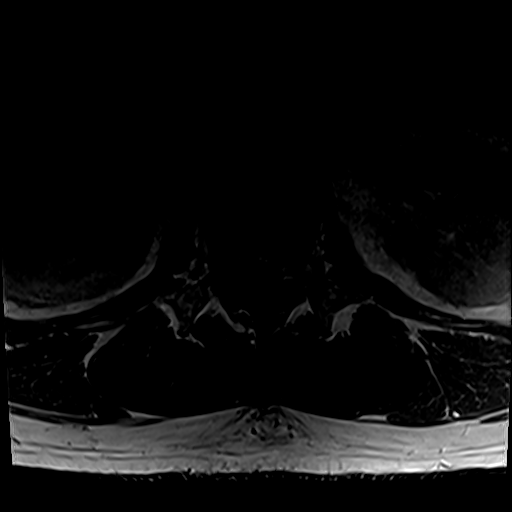
[im 45/45]
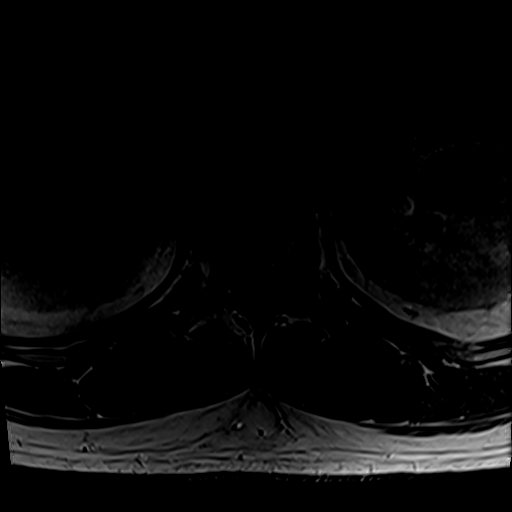

[31 of 48 positions shown; findings below may reference images not displayed]

FINDINGS: Segmentation:   5 non rib-bearing lumbar type vertebral bodies are
present. The lowest fully formed vertebral body is L5.

Alignment:  Physiologic.

Vertebrae:  No fracture, evidence of discitis, or bone lesion.

Conus medullaris and cauda equina: Conus extends to the T12 level.
Conus and cauda equina appear normal.

Paraspinal and other soft tissues: Negative.

Disc spaces:

T12-L1: No significant disc bulge. No neural foraminal stenosis. No
central canal stenosis.

L1-L2: Moderate broad-based disc bulge with narrowing of lateral
recess bilaterally. No significant neural foraminal stenosis. Mild
facet joint arthropathy.

L2-L3: Moderate to severe asymmetric disc protrusion to the right
with marked narrowing of the right lateral recess and encroachment
of the descending nerve roots. Mild-to-moderate left lateral recess
stenosis. No significant neural foraminal stenosis.

L3-L4: Moderate size central disc protrusion with spinal canal
stenosis. Mild left neural foraminal stenosis. Mild facet joint
arthropathy.

L4-L5: Severe broad-based disc bulge into the spinal canal with
moderate stenosis. Marked stenosis of the bilateral lateral
recesses. Mild bilateral neural foraminal stenosis.

L5-S1: Asymmetric disc protrusion to the left with marked stenosis
of the left lateral recess. Moderate left neural foraminal stenosis.
Right lateral recess stenosis without significant right neural
foraminal stenosis. Mild bilateral facet joint arthropathy
IMPRESSION: 1.  No evidence of acute fracture or subluxation.

2. Moderate to severe multilevel degenerate disc disease as detailed
above.

## 2023-09-17 ENCOUNTER — Telehealth: Admitting: Physician Assistant

## 2023-09-17 DIAGNOSIS — R3989 Other symptoms and signs involving the genitourinary system: Secondary | ICD-10-CM

## 2023-09-17 MED ORDER — SULFAMETHOXAZOLE-TRIMETHOPRIM 800-160 MG PO TABS: 1.0000 | ORAL_TABLET | Freq: Two times a day (BID) | ORAL | 0 refills | Status: AC

## 2023-09-17 NOTE — Progress Notes (Signed)
 E-Visit for Urinary Problems  We are sorry that you are not feeling well.  Here is how we plan to help!  Based on what you shared with me it looks like you most likely have a simple urinary tract infection.  A UTI (Urinary Tract Infection) is a bacterial infection of the bladder.  Most cases of urinary tract infections are simple to treat but a key part of your care is to encourage you to drink plenty of fluids and watch your symptoms carefully.  I have prescribed Bactrim DS One tablet twice a day for 5 days.  Your symptoms should gradually improve. Call us if the burning in your urine worsens, you develop worsening fever, back pain or pelvic pain or if your symptoms do not resolve after completing the antibiotic.  Urinary tract infections can be prevented by drinking plenty of water to keep your body hydrated.  Also be sure when you wipe, wipe from front to back and don't hold it in!  If possible, empty your bladder every 4 hours.  HOME CARE Drink plenty of fluids Compete the full course of the antibiotics even if the symptoms resolve Remember, when you need to go.go. Holding in your urine can increase the likelihood of getting a UTI! GET HELP RIGHT AWAY IF: You cannot urinate You get a high fever Worsening back pain occurs You see blood in your urine You feel sick to your stomach or throw up You feel like you are going to pass out  MAKE SURE YOU  Understand these instructions. Will watch your condition. Will get help right away if you are not doing well or get worse.   Thank you for choosing an e-visit.  Your e-visit answers were reviewed by a board certified advanced clinical practitioner to complete your personal care plan. Depending upon the condition, your plan could have included both over the counter or prescription medications.  Please review your pharmacy choice. Make sure the pharmacy is open so you can pick up prescription now. If there is a problem, you may contact  your provider through [employer] and have the prescription routed to another pharmacy.  Your safety is important to [place]. If you have drug allergies check your prescription carefully.   For the next 24 hours you can use MyChart to ask questions about today's visit, request a non-urgent call back, or ask for a work or school excuse. You will get an email in the next two days asking about your experience. I hope that your e-visit has been valuable and will speed your recovery.    I have spent 5 minutes in review of e-visit questionnaire, review and updating patient chart, medical decision making and response to patient.   Margaretann Loveless, PA-C

## 2023-11-02 ENCOUNTER — Other Ambulatory Visit: Payer: Self-pay | Admitting: Family Medicine

## 2023-11-02 DIAGNOSIS — I10 Essential (primary) hypertension: Secondary | ICD-10-CM

## 2023-11-26 ENCOUNTER — Other Ambulatory Visit: Payer: Self-pay | Admitting: Family Medicine

## 2023-11-26 DIAGNOSIS — I10 Essential (primary) hypertension: Secondary | ICD-10-CM
